# Patient Record
Sex: Female | Born: 1935
Health system: Southern US, Community
[De-identification: ages and names within clinical notes are randomized; demographics above are authoritative.]

## PROBLEM LIST (undated history)

## (undated) DIAGNOSIS — M1990 Unspecified osteoarthritis, unspecified site: Secondary | ICD-10-CM

## (undated) DIAGNOSIS — I10 Essential (primary) hypertension: Secondary | ICD-10-CM

## (undated) HISTORY — DX: Essential (primary) hypertension: I10

---

## 2000-12-26 ENCOUNTER — Other Ambulatory Visit: Admission: RE | Admit: 2000-12-26 | Discharge: 2000-12-26 | Payer: Self-pay | Admitting: Family Medicine

## 2001-01-09 ENCOUNTER — Encounter: Payer: Self-pay | Admitting: Family Medicine

## 2003-02-07 HISTORY — PX: BREAST SURGERY: SHX581

## 2004-01-09 HISTORY — PX: BREAST BIOPSY: SHX20

## 2004-06-08 ENCOUNTER — Ambulatory Visit: Payer: Self-pay | Admitting: Family Medicine

## 2005-08-02 ENCOUNTER — Ambulatory Visit: Payer: Self-pay | Admitting: Family Medicine

## 2006-08-13 ENCOUNTER — Ambulatory Visit: Payer: Self-pay | Admitting: Family Medicine

## 2007-02-05 ENCOUNTER — Ambulatory Visit: Payer: Self-pay | Admitting: Family Medicine

## 2007-08-18 ENCOUNTER — Ambulatory Visit: Payer: Self-pay | Admitting: Family Medicine

## 2008-08-18 ENCOUNTER — Ambulatory Visit: Payer: Self-pay | Admitting: Family Medicine

## 2009-08-23 ENCOUNTER — Ambulatory Visit: Payer: Self-pay | Admitting: Family Medicine

## 2010-02-08 ENCOUNTER — Ambulatory Visit: Payer: Self-pay | Admitting: Family Medicine

## 2010-09-07 ENCOUNTER — Ambulatory Visit: Payer: Self-pay | Admitting: Family Medicine

## 2011-09-11 ENCOUNTER — Ambulatory Visit: Payer: Self-pay | Admitting: Family Medicine

## 2012-09-11 ENCOUNTER — Ambulatory Visit: Payer: Self-pay | Admitting: Family Medicine

## 2012-10-15 ENCOUNTER — Ambulatory Visit: Payer: Self-pay | Admitting: Family Medicine

## 2013-10-14 ENCOUNTER — Ambulatory Visit: Payer: Self-pay | Admitting: Family Medicine

## 2013-11-02 DIAGNOSIS — Z87898 Personal history of other specified conditions: Secondary | ICD-10-CM | POA: Insufficient documentation

## 2013-11-02 DIAGNOSIS — Z85828 Personal history of other malignant neoplasm of skin: Secondary | ICD-10-CM | POA: Insufficient documentation

## 2014-03-25 LAB — LIPID PANEL
CHOLESTEROL: 195 mg/dL (ref 0–200)
HDL: 47 mg/dL (ref 35–70)
LDL CALC: 112 mg/dL
LDL/HDL RATIO: 2.4
Triglycerides: 178 mg/dL — AB (ref 40–160)

## 2014-03-25 LAB — HEPATIC FUNCTION PANEL
ALT: 7 U/L (ref 7–35)
AST: 19 U/L (ref 13–35)
Alkaline Phosphatase: 53 U/L (ref 25–125)
BILIRUBIN, TOTAL: 0.5 mg/dL

## 2014-03-25 LAB — TSH: TSH: 2.15 u[IU]/mL (ref 0.41–5.90)

## 2014-03-25 LAB — CBC AND DIFFERENTIAL
HCT: 39 % (ref 36–46)
Hemoglobin: 12.6 g/dL (ref 12.0–16.0)
NEUTROS ABS: 2 /uL
PLATELETS: 219 10*3/uL (ref 150–399)
WBC: 3.5 10^3/mL

## 2014-03-25 LAB — BASIC METABOLIC PANEL
BUN: 13 mg/dL (ref 4–21)
Creatinine: 0.8 mg/dL (ref 0.5–1.1)
GLUCOSE: 88 mg/dL
Potassium: 4.1 mmol/L (ref 3.4–5.3)
Sodium: 136 mmol/L — AB (ref 137–147)

## 2014-06-22 ENCOUNTER — Telehealth: Payer: Self-pay | Admitting: Family Medicine

## 2014-06-22 NOTE — Telephone Encounter (Signed)
Pt stated that she has had an OV for her the numbness in her right leg just above her knee. Pt stated that she has been taking the Tylenol and it helped a little but it is getting worse. Pt stated she would like something to help it feel better. [place] Thanks TNP

## 2014-06-22 NOTE — Telephone Encounter (Signed)
Patient reports that she has had the pain for years (over 15), and states pain has worsened over the past 4-5 months. Patient denies any pain. However, she does experience some numbness and tingling that comes and goes. Patient reports that her symptoms is located on right leg just above her knee. Her symptoms does not radiate. He denies having any difficulty ambulating. She reports that the symptoms usually resolve with Tylenol, but in the past couple of weeks, patient has not had any relief. Patient is requesting something to be called in to help with her occasional pain. She uses [facility] [address]. Contact info is correct. Thanks!

## 2014-06-23 NOTE — Telephone Encounter (Signed)
May need to be further evaluated as this might be meralgia paresthetica which is a type of pinched nerve.

## 2014-06-25 NOTE — Telephone Encounter (Signed)
LMTCB ED 

## 2014-06-28 NOTE — Telephone Encounter (Signed)
Patient advised and will make appointment to be seen.  Kaitlyn Zuniga

## 2014-06-29 ENCOUNTER — Ambulatory Visit (INDEPENDENT_AMBULATORY_CARE_PROVIDER_SITE_OTHER): Payer: Medicare Other | Admitting: Family Medicine

## 2014-06-29 ENCOUNTER — Ambulatory Visit
Admission: RE | Admit: 2014-06-29 | Discharge: 2014-06-29 | Disposition: A | Discharge status: Home / Self Care | Payer: Medicare Other | Source: Ambulatory Visit | Attending: Family Medicine | Admitting: Family Medicine

## 2014-06-29 ENCOUNTER — Encounter: Payer: Self-pay | Admitting: Family Medicine

## 2014-06-29 VITALS — BP 126/74 | HR 78 | Temp 98.1°F | Resp 16 | Ht 65.0 in | Wt 164.8 lb

## 2014-06-29 DIAGNOSIS — G5711 Meralgia paresthetica, right lower limb: Secondary | ICD-10-CM | POA: Diagnosis not present

## 2014-06-29 NOTE — Patient Instructions (Signed)
We will call with x-ray result

## 2014-06-29 NOTE — Progress Notes (Signed)
Subjective:     Patient ID: Kaitlyn Zuniga, female   DOB: 02-Aug-1932, 81 y.o.   MRN: [id number]  HPI  Chief Complaint  Patient presents with  . Numbness    Patient comes in office today with concerns of numbness around her right thigh. Patient states for the past 16 years she had dealt with numbness on the top of her thigh it a general spot but has since spread in the past 5 months to the entire thigh.      Review of Systems  Musculoskeletal: Negative for back pain.       States her thigh is mildly uncomfortable when she touches it. No change in muscle strenght       Objective:   Physical Exam Muscle strength in lower extremities 5/5. SLR's to 90 degrees without back pain or radiation of pain.    Assessment:     1. Meralgia paresthetica, right - DG Lumbar Spine Complete; Future    Plan:     Consider neurology evaluation if patient wishes to proceed further

## 2014-06-30 ENCOUNTER — Telehealth: Payer: Self-pay

## 2014-06-30 DIAGNOSIS — G5711 Meralgia paresthetica, right lower limb: Secondary | ICD-10-CM

## 2014-06-30 NOTE — Telephone Encounter (Signed)
-----   Message from Carmon Ginsberg, [place] sent at 06/29/2014  4:25 PM EDT ----- Mild arthritic changes. No fractures or bony destruction. Does she wish to see a neurologist?

## 2014-06-30 NOTE — Telephone Encounter (Signed)
Advised pt of results. Pt does wish to see neurology. Renaldo Fiddler, CMA

## 2014-07-02 DIAGNOSIS — G5710 Meralgia paresthetica, unspecified lower limb: Secondary | ICD-10-CM | POA: Insufficient documentation

## 2014-07-02 NOTE — Telephone Encounter (Signed)
I placed the order in the chart. Judson Roch, did this go into your work queue? Renaldo Fiddler, CMA

## 2014-07-02 NOTE — Telephone Encounter (Signed)
Pt called because she hasn't heard anything about getting a neuro appointment.  Thanks TNP

## 2014-07-06 NOTE — Telephone Encounter (Signed)
-----   Message from Carmon Ginsberg, [place] sent at 06/29/2014  4:25 PM EDT ----- Mild arthritic changes. No fractures or bony destruction. Does she wish to see a neurologist?

## 2014-07-06 NOTE — Telephone Encounter (Signed)
Patient has been advised

## 2014-09-28 ENCOUNTER — Encounter: Payer: Self-pay | Admitting: Family Medicine

## 2014-09-28 ENCOUNTER — Other Ambulatory Visit: Payer: Self-pay | Admitting: Family Medicine

## 2014-09-28 ENCOUNTER — Ambulatory Visit (INDEPENDENT_AMBULATORY_CARE_PROVIDER_SITE_OTHER): Payer: Medicare Other | Admitting: Family Medicine

## 2014-09-28 VITALS — BP 146/70 | HR 90 | Temp 98.2°F | Resp 16 | Ht 65.0 in | Wt 164.0 lb

## 2014-09-28 DIAGNOSIS — D239 Other benign neoplasm of skin, unspecified: Secondary | ICD-10-CM | POA: Insufficient documentation

## 2014-09-28 DIAGNOSIS — I10 Essential (primary) hypertension: Secondary | ICD-10-CM | POA: Insufficient documentation

## 2014-09-28 DIAGNOSIS — E7800 Pure hypercholesterolemia, unspecified: Secondary | ICD-10-CM | POA: Insufficient documentation

## 2014-09-28 DIAGNOSIS — G5710 Meralgia paresthetica, unspecified lower limb: Secondary | ICD-10-CM | POA: Insufficient documentation

## 2014-09-28 DIAGNOSIS — Z85828 Personal history of other malignant neoplasm of skin: Secondary | ICD-10-CM | POA: Insufficient documentation

## 2014-09-28 DIAGNOSIS — M8580 Other specified disorders of bone density and structure, unspecified site: Secondary | ICD-10-CM | POA: Insufficient documentation

## 2014-09-28 DIAGNOSIS — Z8742 Personal history of other diseases of the female genital tract: Secondary | ICD-10-CM | POA: Insufficient documentation

## 2014-09-28 DIAGNOSIS — Z0000 Encounter for general adult medical examination without abnormal findings: Secondary | ICD-10-CM

## 2014-09-28 DIAGNOSIS — Z23 Encounter for immunization: Secondary | ICD-10-CM

## 2014-09-28 DIAGNOSIS — M1990 Unspecified osteoarthritis, unspecified site: Secondary | ICD-10-CM | POA: Insufficient documentation

## 2014-09-28 DIAGNOSIS — J302 Other seasonal allergic rhinitis: Secondary | ICD-10-CM | POA: Insufficient documentation

## 2014-09-28 DIAGNOSIS — G5600 Carpal tunnel syndrome, unspecified upper limb: Secondary | ICD-10-CM | POA: Insufficient documentation

## 2014-09-28 DIAGNOSIS — F09 Unspecified mental disorder due to known physiological condition: Secondary | ICD-10-CM | POA: Insufficient documentation

## 2014-09-28 DIAGNOSIS — Z1231 Encounter for screening mammogram for malignant neoplasm of breast: Secondary | ICD-10-CM

## 2014-09-28 NOTE — Progress Notes (Signed)
Patient ID: Mcneil Sober, female   DOB: 1933/01/02, 81 y.o.   MRN: [id number] Patient: Kaitlyn Zuniga, Female    DOB: 08/31/1932, 81 y.o.   MRN: [id number] Visit Date: 09/28/2014  Today's Provider: [name], MD   Chief Complaint  Patient presents with  . Annual Exam   Subjective:   Kaitlyn Zuniga is a 81 y.o. female who presents today for her Subsequent Annual Wellness Visit. She feels well. She reports she is exercising about 5 days a wee. She reports she is sleeping well.    Prevnar- 09/24/13 Tdap 10/18/10 Zoster 01/10/06 Pap- 01/29/07 WNL mammo 09/2013 Colon- 01/09/01- Small superficial almost puncture rectal ulcers BMD- 10/15/12 Osteopenia (femur area) otherwise normal   Review of Systems  Constitutional: Negative.   HENT: Positive for hearing loss and sinus pressure.   Eyes: Negative.   Respiratory: Negative.   Cardiovascular: Negative.   Gastrointestinal: Positive for constipation.  Endocrine: Negative.   Genitourinary: Negative.   Musculoskeletal: Negative.   Skin: Negative.   Allergic/Immunologic: Negative.   Neurological: Positive for numbness.  Hematological: Negative.   Psychiatric/Behavioral: Negative.     Patient Active Problem List   Diagnosis Date Noted  . Dermatofibroma 09/28/2014  . Essential (primary) hypertension 09/28/2014  . Personal history of reproductive and obstetrical problems 09/28/2014  . History of other malignant neoplasm of skin 09/28/2014  . Mild cognitive disorder 09/28/2014  . Bernhardt's paresthesia 09/28/2014  . Arthritis, degenerative 09/28/2014  . Osteopenia 09/28/2014  . Allergic rhinitis, seasonal 09/28/2014  . Hypercholesterolemia without hypertriglyceridemia 09/28/2014  . Carpal tunnel syndrome 09/28/2014  . Meralgia paraesthetica 07/02/2014  . H/O neoplasm 11/02/2013    Social History   Social History  . Marital Status: Married    Spouse Name: N/A  . Number of Children: N/A  . Years of Education: N/A    Occupational History  . Not on file.   Social History Main Topics  . Smoking status: Former Smoker -- 10 years  . Smokeless tobacco: Never Used     Comment: quit 30 years ago.   . Alcohol Use: No  . Drug Use: No  . Sexual Activity: Not on file   Other Topics Concern  . Not on file   Social History Narrative    Past Surgical History  Procedure Laterality Date  . Breast surgery Right [id number]    Biopsy    Her family history includes Cancer in her father and mother; Cancer (age of onset: 41) in her sister; Hypertension in her father.    Outpatient Prescriptions Prior to Visit  Medication Sig Dispense Refill  . aspirin EC 81 MG tablet Take 81 mg by mouth.    Marland Kitchen lisinopril (PRINIVIL,ZESTRIL) 10 MG tablet   3  . fluticasone (FLONASE) 50 MCG/ACT nasal spray   0  . Multiple Vitamin (MULTI-VITAMINS) TABS Take by mouth.    . Omega-3 Fatty Acids (FISH OIL) 1000 MG CAPS Take by mouth.     No facility-administered medications prior to visit.    No Known Allergies  Patient Care Team: Jerrol Banana., MD as PCP - General (Family Medicine)  Objective:   Vitals:  Filed Vitals:   09/28/14 0911  BP: 146/70  Pulse: 90  Temp: 98.2 F (36.8 C)  TempSrc: Oral  Resp: 16  Height: 5\' 5"  (1.651 m)  Weight: 164 lb (74.39 kg)    Physical Exam  Constitutional: She is oriented to person, place, and time. She appears well-developed and well-nourished.  HENT:  Head: Normocephalic and atraumatic.  Right Ear: External ear normal.  Left Ear: External ear normal.  Nose: Nose normal.  Mouth/Throat: Oropharynx is clear and moist.  Eyes: Conjunctivae are normal.  Neck: Neck supple.  Cardiovascular: Normal rate, regular rhythm and normal heart sounds.   Pulmonary/Chest: Effort normal and breath sounds normal.  Abdominal: Soft.  Neurological: She is alert and oriented to person, place, and time.  Skin: Skin is warm and dry.  Psychiatric: She has a normal mood and affect. Her  behavior is normal. Judgment and thought content normal.    Activities of Daily Living In your present state of health, do you have any difficulty performing the following activities: 09/28/2014  Hearing? Y  Vision? N  Difficulty concentrating or making decisions? N  Walking or climbing stairs? N  Dressing or bathing? N  Doing errands, shopping? N    Fall Risk Assessment Fall Risk  09/28/2014  Falls in the past year? No     Depression Screen PHQ 2/9 Scores 09/28/2014  PHQ - 2 Score 0    Cognitive Testing - 6-CIT    Year: 0  points  Month: 0 points  Memorize "[name], [address], [place]"  Time (within 1 hour:) 0 points  Count backwards from 20: 0 points  Name months of year: 0 points  Repeat Address: 7  points   Total Score: 7/28  Interpretation : Normal (0-7) Abnormal (8-28)    Assessment & Plan:     Annual Wellness Visit  Reviewed patient's Family Medical History Reviewed and updated list of patient's medical providers Assessment of cognitive impairment was done Assessed patient's functional ability Established a written schedule for health screening [place] Completed and Reviewed  Exercise Activities and Dietary recommendations Goals    None       There is no immunization history on file for this patient.  Health Maintenance  Topic Date Due  . TETANUS/TDAP  03/08/1953  . COLONOSCOPY  03/08/1984  . ZOSTAVAX  03/09/1994  . DEXA SCAN  03/09/1999  . PNA vac Low Risk Adult (1 of 2 - PCV13) 03/09/1999  . INFLUENZA VACCINE  08/09/2014      Discussed health benefits of physical activity, and encouraged her to engage in regular exercise appropriate for her age and condition.  White coat hypertension Chronic idiopathic constipation Patient uses prunes and milk of magnesia  [facility] 09/28/2014 9:17  AM  ------------------------------------------------------------------------------------------------------------

## 2014-10-06 ENCOUNTER — Other Ambulatory Visit: Payer: Self-pay | Admitting: Family Medicine

## 2014-10-18 ENCOUNTER — Ambulatory Visit
Admission: RE | Admit: 2014-10-18 | Discharge: 2014-10-18 | Disposition: A | Discharge status: Home / Self Care | Payer: Medicare Other | Source: Ambulatory Visit | Attending: Family Medicine | Admitting: Family Medicine

## 2014-10-18 DIAGNOSIS — Z1231 Encounter for screening mammogram for malignant neoplasm of breast: Secondary | ICD-10-CM | POA: Diagnosis not present

## 2015-01-09 ENCOUNTER — Other Ambulatory Visit: Payer: Self-pay | Admitting: Family Medicine

## 2015-02-16 DIAGNOSIS — L570 Actinic keratosis: Secondary | ICD-10-CM | POA: Diagnosis not present

## 2015-02-16 DIAGNOSIS — D225 Melanocytic nevi of trunk: Secondary | ICD-10-CM | POA: Diagnosis not present

## 2015-02-16 DIAGNOSIS — L538 Other specified erythematous conditions: Secondary | ICD-10-CM | POA: Diagnosis not present

## 2015-02-16 DIAGNOSIS — D2262 Melanocytic nevi of left upper limb, including shoulder: Secondary | ICD-10-CM | POA: Diagnosis not present

## 2015-02-16 DIAGNOSIS — X32XXXA Exposure to sunlight, initial encounter: Secondary | ICD-10-CM | POA: Diagnosis not present

## 2015-02-16 DIAGNOSIS — L298 Other pruritus: Secondary | ICD-10-CM | POA: Diagnosis not present

## 2015-02-16 DIAGNOSIS — D2272 Melanocytic nevi of left lower limb, including hip: Secondary | ICD-10-CM | POA: Diagnosis not present

## 2015-02-16 DIAGNOSIS — L821 Other seborrheic keratosis: Secondary | ICD-10-CM | POA: Diagnosis not present

## 2015-02-16 DIAGNOSIS — L820 Inflamed seborrheic keratosis: Secondary | ICD-10-CM | POA: Diagnosis not present

## 2015-03-25 DIAGNOSIS — H40003 Preglaucoma, unspecified, bilateral: Secondary | ICD-10-CM | POA: Diagnosis not present

## 2015-03-28 ENCOUNTER — Ambulatory Visit (INDEPENDENT_AMBULATORY_CARE_PROVIDER_SITE_OTHER): Payer: Medicare Other | Admitting: Family Medicine

## 2015-03-28 VITALS — BP 132/58 | HR 90 | Temp 98.3°F | Resp 16 | Wt 165.0 lb

## 2015-03-28 DIAGNOSIS — E785 Hyperlipidemia, unspecified: Secondary | ICD-10-CM | POA: Diagnosis not present

## 2015-03-28 DIAGNOSIS — I10 Essential (primary) hypertension: Secondary | ICD-10-CM

## 2015-03-28 NOTE — Progress Notes (Signed)
Patient ID: Kaitlyn Zuniga, female   DOB: 07/13/1932, 82 y.o.   MRN: [id number]   Kaitlyn Zuniga  MRN: [id number] DOB: 03/16/1932  Subjective:  HPI   1. Essential hypertension 2. Hyperlipidemia  The patient is an 82 year old female who presents for 6 month follow up.  She was last seen in September for her wellness exam.  She reports that her blood pressure has been well.  Running usually 120's-130's over 60's.  She is currently on Lisinopril and has no complaints or adverse effects of the medicine.  She is currently not on any medicine for her cholesterol and has never taken any.  She reports that since her last visit she has been to see Dr. Manuella Ghazi, Neurologist in October regarding the numbness in her right thigh.  She reports that he performed acupuncture and the numbness that has been there for 16 years is now gone. She does state that she has begun having numbness in her right first 2 fingers.  Dr Manuella Ghazi has told her to wait and see how it does before doing anything.   She has also been to see her eye doctor, Dr. Mali and reports her vision was 20/20 in one eye and 20/25 in the other.   The patient states she has had a little bit of a cold for the last 4 days with some cough.  Usually her cough is at night and productive of clear sputum.       Patient Active Problem List   Diagnosis Date Noted  . Dermatofibroma 09/28/2014  . Essential (primary) hypertension 09/28/2014  . Personal history of reproductive and obstetrical problems 09/28/2014  . History of other malignant neoplasm of skin 09/28/2014  . Mild cognitive disorder 09/28/2014  . Bernhardt's paresthesia 09/28/2014  . Arthritis, degenerative 09/28/2014  . Osteopenia 09/28/2014  . Allergic rhinitis, seasonal 09/28/2014  . Hypercholesterolemia without hypertriglyceridemia 09/28/2014  . Carpal tunnel syndrome 09/28/2014  . Meralgia paraesthetica 07/02/2014  . H/O neoplasm 11/02/2013    Past Medical History  Diagnosis Date    . Hypertension     Social History   Social History  . Marital Status: Married    Spouse Name: N/A  . Number of Children: N/A  . Years of Education: N/A   Occupational History  . Not on file.   Social History Main Topics  . Smoking status: Former Smoker -- 10 years  . Smokeless tobacco: Never Used     Comment: quit 30 years ago.   . Alcohol Use: No  . Drug Use: No  . Sexual Activity: Not on file   Other Topics Concern  . Not on file   Social History Narrative    Outpatient Prescriptions Prior to Visit  Medication Sig Dispense Refill  . aspirin EC 81 MG tablet Take 81 mg by mouth.    . fluticasone (FLONASE) 50 MCG/ACT nasal spray Place into the nose.    Marland Kitchen lisinopril (PRINIVIL,ZESTRIL) 10 MG tablet TAKE ONE TABLET BY MOUTH ONCE DAILY 90 tablet 0  . MULTIPLE VITAMIN PO Take by mouth.    . Omega-3 Fatty Acids (FISH OIL) 1200 MG CAPS Take by mouth.     No facility-administered medications prior to visit.    No Known Allergies  Review of Systems  Constitutional: Negative for fever, chills, malaise/fatigue and diaphoresis.  Respiratory: Positive for cough and sputum production. Negative for shortness of breath and wheezing.   Cardiovascular: Negative for chest pain, palpitations, orthopnea and leg swelling.  Neurological:  Negative for dizziness, weakness and headaches.   Objective:  BP 132/58 mmHg  Pulse 90  Temp(Src) 98.3 F (36.8 C) (Oral)  Resp 16  Wt 165 lb (74.844 kg)  Physical Exam  Constitutional: She is oriented to person, place, and time and well-developed, well-nourished, and in no distress.  HENT:  Head: Normocephalic and atraumatic.  Right Ear: External ear normal.  Left Ear: External ear normal.  Nose: Nose normal.  Eyes: Conjunctivae are normal. Pupils are equal, round, and reactive to light.  Neck: Normal range of motion.  Cardiovascular: Normal rate, regular rhythm and normal heart sounds.   Pulmonary/Chest: Effort normal and breath sounds  normal.  Neurological: She is alert and oriented to person, place, and time. Gait normal.  Neuro exam of upper extremities is normal.  Skin: Skin is warm and dry.  Psychiatric: Mood, memory, affect and judgment normal.    Assessment and Plan :   1. Essential hypertension  - CBC With Differential/Platelet - COMPLETE METABOLIC PANEL WITH GFR - TSH  2. Hyperlipidemia  - Lipid Panel With LDL/HDL Ratio 3. 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 She sees Dr. Manuella Ghazi. I have done the exam and reviewed the above chart and it is accurate to the best of my knowledge.  Miguel Aschoff MD [facility] 03/28/2015 8:16 AM

## 2015-03-30 DIAGNOSIS — I10 Essential (primary) hypertension: Secondary | ICD-10-CM | POA: Diagnosis not present

## 2015-03-30 DIAGNOSIS — E785 Hyperlipidemia, unspecified: Secondary | ICD-10-CM | POA: Diagnosis not present

## 2015-03-31 LAB — LIPID PANEL WITH LDL/HDL RATIO
Cholesterol, Total: 154 mg/dL (ref 100–199)
HDL: 38 mg/dL — ABNORMAL LOW (ref >39)
LDL Calculated: 88 mg/dL (ref 0–99)
LDL/HDL RATIO: 2.3 ratio (ref 0.0–3.2)
Triglycerides: 140 mg/dL (ref 0–149)
VLDL Cholesterol Cal: 28 mg/dL (ref 5–40)

## 2015-03-31 LAB — COMPREHENSIVE METABOLIC PANEL
A/G RATIO: 1.9 (ref 1.2–2.2)
ALBUMIN: 4.3 g/dL (ref 3.5–4.7)
ALT: 8 IU/L (ref 0–32)
AST: 19 IU/L (ref 0–40)
Alkaline Phosphatase: 54 IU/L (ref 39–117)
BUN/Creatinine Ratio: 14 (ref 11–26)
BUN: 13 mg/dL (ref 8–27)
Bilirubin Total: 0.5 mg/dL (ref 0.0–1.2)
CALCIUM: 9.6 mg/dL (ref 8.7–10.3)
CO2: 26 mmol/L (ref 18–29)
CREATININE: 0.91 mg/dL (ref 0.57–1.00)
Chloride: 98 mmol/L (ref 96–106)
GFR calc Af Amer: 68 mL/min/{1.73_m2} (ref >59)
GFR, EST NON AFRICAN AMERICAN: 59 mL/min/{1.73_m2} — AB (ref >59)
GLOBULIN, TOTAL: 2.3 g/dL (ref 1.5–4.5)
Glucose: 95 mg/dL (ref 65–99)
POTASSIUM: 4.2 mmol/L (ref 3.5–5.2)
SODIUM: 138 mmol/L (ref 134–144)
Total Protein: 6.6 g/dL (ref 6.0–8.5)

## 2015-03-31 LAB — CBC WITH DIFFERENTIAL/PLATELET
Basophils Absolute: 0 10*3/uL (ref 0.0–0.2)
Basos: 1 %
EOS (ABSOLUTE): 0.1 10*3/uL (ref 0.0–0.4)
EOS: 1 %
HEMATOCRIT: 35.7 % (ref 34.0–46.6)
HEMOGLOBIN: 12.1 g/dL (ref 11.1–15.9)
Immature Grans (Abs): 0 10*3/uL (ref 0.0–0.1)
Immature Granulocytes: 0 %
LYMPHS ABS: 1.8 10*3/uL (ref 0.7–3.1)
Lymphs: 50 %
MCH: 30.2 pg (ref 26.6–33.0)
MCHC: 33.9 g/dL (ref 31.5–35.7)
MCV: 89 fL (ref 79–97)
MONOCYTES: 10 %
MONOS ABS: 0.4 10*3/uL (ref 0.1–0.9)
Neutrophils Absolute: 1.3 10*3/uL — ABNORMAL LOW (ref 1.4–7.0)
Neutrophils: 38 %
Platelets: 198 10*3/uL (ref 150–379)
RBC: 4.01 x10E6/uL (ref 3.77–5.28)
RDW: 13.9 % (ref 12.3–15.4)
WBC: 3.5 10*3/uL (ref 3.4–10.8)

## 2015-03-31 LAB — TSH: TSH: 1.92 u[IU]/mL (ref 0.450–4.500)

## 2015-04-10 ENCOUNTER — Other Ambulatory Visit: Payer: Self-pay | Admitting: Family Medicine

## 2015-07-25 ENCOUNTER — Other Ambulatory Visit: Payer: Self-pay | Admitting: Family Medicine

## 2015-07-29 DIAGNOSIS — G5711 Meralgia paresthetica, right lower limb: Secondary | ICD-10-CM | POA: Diagnosis not present

## 2015-07-29 DIAGNOSIS — R200 Anesthesia of skin: Secondary | ICD-10-CM | POA: Diagnosis not present

## 2015-09-26 DIAGNOSIS — G5603 Carpal tunnel syndrome, bilateral upper limbs: Secondary | ICD-10-CM | POA: Diagnosis not present

## 2015-09-27 DIAGNOSIS — G5601 Carpal tunnel syndrome, right upper limb: Secondary | ICD-10-CM | POA: Diagnosis not present

## 2015-09-29 ENCOUNTER — Other Ambulatory Visit: Payer: Self-pay | Admitting: Family Medicine

## 2015-09-29 ENCOUNTER — Ambulatory Visit (INDEPENDENT_AMBULATORY_CARE_PROVIDER_SITE_OTHER): Payer: Medicare Other | Admitting: Family Medicine

## 2015-09-29 VITALS — BP 166/60 | HR 74 | Temp 97.8°F | Resp 16 | Ht 64.5 in | Wt 160.0 lb

## 2015-09-29 DIAGNOSIS — Z23 Encounter for immunization: Secondary | ICD-10-CM

## 2015-09-29 DIAGNOSIS — Z1231 Encounter for screening mammogram for malignant neoplasm of breast: Secondary | ICD-10-CM

## 2015-09-29 DIAGNOSIS — Z1211 Encounter for screening for malignant neoplasm of colon: Secondary | ICD-10-CM

## 2015-09-29 NOTE — Progress Notes (Signed)
Patient: Kaitlyn Zuniga, Female    DOB: January 09, 1932, 82 y.o.   MRN: [id number] Visit Date: 09/29/2015  Today's Provider: [name], MD   No chief complaint on file.  Subjective:   Kaitlyn Zuniga is a 82 y.o. female who presents today for her Subsequent Annual Wellness Visit. She feels well. She reports exercising 3 times per week. She reports she is sleeping well.  01/09/2001-Colonoscopy 02/08/2010 BMD 10/19/2014-Mammogram  Immunization History  Administered Date(s) Administered  . Influenza, High Dose Seasonal PF 09/28/2014  . Pneumococcal Conjugate-13 09/24/2013  . Pneumococcal Polysaccharide-23 11/16/1999  . Td 06/06/2004  . Tdap 10/18/2010  . Zoster 01/10/2006   Patient would like to receive her flu shot today.  Review of Systems  Constitutional: Negative.   HENT: Positive for hearing loss.   Eyes: Negative.   Respiratory: Negative.   Cardiovascular: Negative.   Gastrointestinal: Negative.   Endocrine: Negative.   Genitourinary: Positive for difficulty urinating.  Musculoskeletal: Negative.   Allergic/Immunologic: Positive for environmental allergies.  Neurological: Positive for numbness.  Hematological: Bruises/bleeds easily.  Psychiatric/Behavioral: Negative.     Patient Active Problem List   Diagnosis Date Noted  . Dermatofibroma 09/28/2014  . Essential (primary) hypertension 09/28/2014  . Personal history of reproductive and obstetrical problems 09/28/2014  . History of other malignant neoplasm of skin 09/28/2014  . Mild cognitive disorder 09/28/2014  . Bernhardt's paresthesia 09/28/2014  . Arthritis, degenerative 09/28/2014  . Osteopenia 09/28/2014  . Allergic rhinitis, seasonal 09/28/2014  . Hypercholesterolemia without hypertriglyceridemia 09/28/2014  . Carpal tunnel syndrome 09/28/2014  . Meralgia paraesthetica 07/02/2014  . H/O neoplasm 11/02/2013    Social History   Social History  . Marital status: Married    Spouse name: N/A  .  Number of children: N/A  . Years of education: N/A   Occupational History  . Not on file.   Social History Main Topics  . Smoking status: Former Smoker    Years: 10.00  . Smokeless tobacco: Never Used     Comment: quit 30 years ago.   . Alcohol use No  . Drug use: No  . Sexual activity: Not on file   Other Topics Concern  . Not on file   Social History Narrative  . No narrative on file    Past Surgical History:  Procedure Laterality Date  . BREAST BIOPSY Right 2006   benign  . BREAST SURGERY Right [id number]   Biopsy    Her family history includes Breast cancer (age of onset: 57) in her sister; Cancer in her father and mother; Hypertension in her father.    Outpatient Encounter Prescriptions as of 09/29/2015  Medication Sig Note  . Alpha-Lipoic Acid 200 MG CAPS Take by mouth. 03/28/2015: Received from: [facility]  . aspirin EC 81 MG tablet Take 81 mg by mouth. 06/29/2014: Received from: [facility]  . calcium carbonate (CALCIUM 600) 600 MG TABS tablet Take by mouth. 03/28/2015: Received from: [facility]  . fluticasone (FLONASE) 50 MCG/ACT nasal spray USE ONE TO TWO SPRAY(S) IN EACH NOSTRIL ONCE DAILY   . lisinopril (PRINIVIL,ZESTRIL) 10 MG tablet TAKE ONE TABLET BY MOUTH ONCE DAILY   . MULTIPLE VITAMIN PO Take by mouth. 09/28/2014: Received from: [facility]  . Omega-3 Fatty Acids (FISH OIL) 1200 MG CAPS Take by mouth. 09/28/2014: Received from: [facility]   No facility-administered encounter medications on file as of 09/29/2015.     No Known Allergies  Patient Care  Team: Jerrol Banana., MD as PCP - General (Family Medicine)  Objective:   Vitals:  Vitals:   09/29/15 0912  BP: (!) 166/60  Pulse: 74  Resp: 16  Temp: 97.8 F (36.6 C)  TempSrc: Oral  Weight: 160 lb (72.6 kg)  Height: 5' 4.5" (1.638 m)    Physical Exam  Constitutional: She is oriented to person, place, and time.  She appears well-developed and well-nourished.  HENT:  Head: Normocephalic and atraumatic.  Right Ear: External ear normal.  Mouth/Throat: Oropharynx is clear and moist.  Eyes: Conjunctivae are normal. Pupils are equal, round, and reactive to light.  Neck: Normal range of motion. Neck supple.  Cardiovascular: Normal rate, regular rhythm, normal heart sounds and intact distal pulses.   Pulmonary/Chest: Effort normal and breath sounds normal.  Abdominal: Soft. Bowel sounds are normal.  Musculoskeletal: Normal range of motion.  Neurological: She is alert and oriented to person, place, and time. She has normal reflexes.  Skin: Skin is warm and dry.  Psychiatric: She has a normal mood and affect. Her behavior is normal. Judgment and thought content normal.    Activities of Daily Living In your present state of health, do you have any difficulty performing the following activities: 09/29/2015  Hearing? N  Vision? N  Difficulty concentrating or making decisions? N  Walking or climbing stairs? N  Dressing or bathing? N  Doing errands, shopping? N  Some recent data might be hidden    Fall Risk Assessment Fall Risk  09/29/2015 09/28/2014  Falls in the past year? No No     Depression Screen PHQ 2/9 Scores 09/29/2015 09/28/2014  PHQ - 2 Score 0 0   Current Exercise Habits: Home exercise routine, Type of exercise: walking, Time (Minutes): 45, Frequency (Times/Week): 3, Weekly Exercise (Minutes/Week): 135, Intensity: Intense   Cognitive Testing - 6-CIT    Year: 0 4 points  Month: 0 3 points  Memorize "[name], [address], [place]"  Time (within 1 hour:) 0 3 points  Count backwards from 20: 0 2 4 points  Name months of year: 0 2 4 points  Repeat Address: 0 2 4 6 8 10  points   Total Score: 6/28  Interpretation : Normal (0-7) Abnormal (8-28)    Assessment & Plan:     Annual Wellness Visit  Reviewed patient's Family Medical History Reviewed and updated list of patient's  medical providers Assessment of cognitive impairment was done Assessed patient's functional ability Established a written schedule for health screening [place] Completed and Reviewed  Exercise Activities and Dietary recommendations Goals    None      Immunization History  Administered Date(s) Administered  . Influenza, High Dose Seasonal PF 09/28/2014  . Pneumococcal Conjugate-13 09/24/2013  . Pneumococcal Polysaccharide-23 11/16/1999  . Td 06/06/2004  . Tdap 10/18/2010  . Zoster 01/10/2006    Health Maintenance  Topic Date Due  . INFLUENZA VACCINE  08/09/2015  . TETANUS/TDAP  10/17/2020  . DEXA SCAN  Completed  . ZOSTAVAX  Completed  . PNA vac Low Risk Adult  Completed     Colon cancer screening with cologard in 82 year old female Discussed health benefits of physical activity, and encouraged her to engage in regular exercise appropriate for her age and condition.    HPI, Exam and A&P Transcribed under the direction and in the presence of [name]., MD. Electronically Signed: Althea Charon, RMA I have done the exam and reviewed the chart and it is accurate  to the best of my knowledge. Miguel Aschoff M.D. [facility]

## 2015-10-19 ENCOUNTER — Ambulatory Visit
Admission: RE | Admit: 2015-10-19 | Discharge: 2015-10-19 | Disposition: A | Discharge status: Home / Self Care | Payer: Medicare Other | Source: Ambulatory Visit | Attending: Family Medicine | Admitting: Family Medicine

## 2015-10-19 DIAGNOSIS — Z1231 Encounter for screening mammogram for malignant neoplasm of breast: Secondary | ICD-10-CM | POA: Diagnosis not present

## 2015-10-21 ENCOUNTER — Telehealth: Payer: Self-pay | Admitting: Family Medicine

## 2015-10-21 NOTE — Telephone Encounter (Signed)
Order for cologuard faxed to [facility] °

## 2015-10-26 DIAGNOSIS — Z1211 Encounter for screening for malignant neoplasm of colon: Secondary | ICD-10-CM | POA: Diagnosis not present

## 2015-10-26 DIAGNOSIS — Z1212 Encounter for screening for malignant neoplasm of rectum: Secondary | ICD-10-CM | POA: Diagnosis not present

## 2015-10-27 LAB — COLOGUARD: COLOGUARD: NEGATIVE

## 2015-11-04 ENCOUNTER — Telehealth: Payer: Self-pay

## 2015-11-04 NOTE — Telephone Encounter (Signed)
lmtcb-need to let patient know Cologuard results were negative-aa

## 2015-11-04 NOTE — Telephone Encounter (Signed)
Pt returned call. Thanks TNP °

## 2015-11-04 NOTE — Telephone Encounter (Signed)
Patient advised as below.  

## 2015-11-29 DIAGNOSIS — G5603 Carpal tunnel syndrome, bilateral upper limbs: Secondary | ICD-10-CM | POA: Diagnosis not present

## 2015-11-29 DIAGNOSIS — G5711 Meralgia paresthetica, right lower limb: Secondary | ICD-10-CM | POA: Diagnosis not present

## 2015-12-15 ENCOUNTER — Encounter: Payer: Self-pay | Admitting: Family Medicine

## 2015-12-15 ENCOUNTER — Ambulatory Visit (INDEPENDENT_AMBULATORY_CARE_PROVIDER_SITE_OTHER): Payer: Medicare Other | Admitting: Family Medicine

## 2015-12-15 VITALS — BP 130/70 | HR 69 | Temp 98.2°F | Resp 14 | Wt 164.2 lb

## 2015-12-15 DIAGNOSIS — R3915 Urgency of urination: Secondary | ICD-10-CM

## 2015-12-15 LAB — POCT URINALYSIS DIPSTICK
BILIRUBIN UA: NEGATIVE
GLUCOSE UA: NEGATIVE
KETONES UA: NEGATIVE
NITRITE UA: NEGATIVE
Protein, UA: NEGATIVE
Spec Grav, UA: 1.01
Urobilinogen, UA: 0.2
pH, UA: 6

## 2015-12-15 MED ORDER — SULFAMETHOXAZOLE-TRIMETHOPRIM 800-160 MG PO TABS: 1.0000 | ORAL_TABLET | Freq: Two times a day (BID) | ORAL | 0 refills | Status: DC

## 2015-12-15 NOTE — Progress Notes (Signed)
Patient: Kaitlyn Zuniga Female    DOB: 20-Mar-1932   82 y.o.   MRN: [id number] Visit Date: 12/15/2015  Today's Provider: Vernie Murders, PA   Chief Complaint  Patient presents with  . Urinary Tract Infection   Subjective:    Urinary Tract Infection   This is a new problem. Episode onset: 4-5 days ago. The problem occurs every urination. The patient is experiencing no pain. Associated symptoms include frequency and urgency. Associated symptoms comments: Foul odor urine, mild back pain. Treatments tried: Cystex. The treatment provided mild relief.   Past Medical History:  Diagnosis Date  . Hypertension    Past Surgical History:  Procedure Laterality Date  . BREAST BIOPSY Right 2006   benign  . BREAST SURGERY Right [id number]   Biopsy   Family History  Problem Relation Age of Onset  . Cancer Mother     lung  . Cancer Father     prostate  . Hypertension Father   . Breast cancer Sister 28   No Known Allergies   Previous Medications   ALPHA-LIPOIC ACID 200 MG CAPS    Take by mouth.   ASPIRIN EC 81 MG TABLET    Take 81 mg by mouth.   CALCIUM CARBONATE (CALCIUM 600) 600 MG TABS TABLET    Take by mouth.   FLUTICASONE (FLONASE) 50 MCG/ACT NASAL SPRAY    USE ONE TO TWO SPRAY(S) IN EACH NOSTRIL ONCE DAILY   LISINOPRIL (PRINIVIL,ZESTRIL) 10 MG TABLET    TAKE ONE TABLET BY MOUTH ONCE DAILY   MULTIPLE VITAMIN PO    Take by mouth.   OMEGA-3 FATTY ACIDS (FISH OIL) 1200 MG CAPS    Take by mouth.    Review of Systems  Constitutional: Negative.   Respiratory: Negative.   Cardiovascular: Negative.   Genitourinary: Positive for frequency and urgency.    Social History  Substance Use Topics  . Smoking status: Former Smoker    Years: 10.00  . Smokeless tobacco: Never Used     Comment: quit 30 years ago.   . Alcohol use No   Objective:   BP 130/70 (BP Location: Right Arm, Patient Position: Sitting, Cuff Size: Normal)   Pulse 69   Temp 98.2 F (36.8 C) (Oral)   Resp 14   Wt  164 lb 3.2 oz (74.5 kg)   BMI 27.75 kg/m   Physical Exam  Constitutional: She appears well-developed and well-nourished. No distress.  HENT:  Head: Normocephalic and atraumatic.  Right Ear: Hearing normal.  Left Ear: Hearing normal.  Nose: Nose normal.  Eyes: Conjunctivae and lids are normal. Right eye exhibits no discharge. Left eye exhibits no discharge. No scleral icterus.  Cardiovascular: Regular rhythm.   Pulmonary/Chest: Effort normal and breath sounds normal. No respiratory distress.  Abdominal: Soft. There is tenderness.  Suprapubic pressure tenderness to palpation. No CVA tenderness to percussion posteriorly.  Musculoskeletal: Normal range of motion.  Skin: Skin is intact. No lesion and no rash noted.  Psychiatric: She has a normal mood and affect. Her speech is normal and behavior is normal. Thought content normal.      Assessment & Plan:     1. Urinary urgency Onset over the past 3-4 days without gross hematuria. No fever. Finds Cystex helps relieve symptoms. Urinalysis showed huge amount of bacterial rods with 15-20 WBC's and clumping. Will get urine C&S and start Septra-DS. Increase fluid intake and recheck pending reports from culture. - POCT Urinalysis Dipstick - Urine culture - sulfamethoxazole-trimethoprim (BACTRIM  DS,SEPTRA DS) 800-160 MG tablet; Take 1 tablet by mouth 2 (two) times daily.  Dispense: 20 tablet; Refill: 0

## 2015-12-15 NOTE — Patient Instructions (Signed)

## 2015-12-19 ENCOUNTER — Telehealth: Payer: Self-pay

## 2015-12-19 DIAGNOSIS — R3915 Urgency of urination: Secondary | ICD-10-CM

## 2015-12-19 LAB — URINE CULTURE

## 2015-12-19 MED ORDER — AMOXICILLIN-POT CLAVULANATE 875-125 MG PO TABS: 1.0000 | ORAL_TABLET | Freq: Two times a day (BID) | ORAL | 0 refills | Status: DC

## 2015-12-19 NOTE — Telephone Encounter (Signed)
-----   Message from Margo Common, [place] sent at 12/19/2015  8:05 AM EST ----- Bacteria isolated on culture was Enterococcus. Need to change antibiotic to one it is sensitive to - Amoxicillin/Clavulanate 875/125 mg BID #20. Drink extra fluids and use Cystex or AZO-Standard if needed for discomfort. Recheck in 10 days if any symptoms remain.

## 2015-12-19 NOTE — Telephone Encounter (Signed)
LMTCB, Amoxicillin sent to [place].

## 2015-12-20 NOTE — Telephone Encounter (Signed)
LMTCB

## 2015-12-20 NOTE — Telephone Encounter (Signed)
Advised pt of lab results. Pt verbally acknowledges understanding. Emily Drozdowski, CMA   

## 2016-02-16 DIAGNOSIS — D2261 Melanocytic nevi of right upper limb, including shoulder: Secondary | ICD-10-CM | POA: Diagnosis not present

## 2016-02-16 DIAGNOSIS — L821 Other seborrheic keratosis: Secondary | ICD-10-CM | POA: Diagnosis not present

## 2016-02-16 DIAGNOSIS — D2272 Melanocytic nevi of left lower limb, including hip: Secondary | ICD-10-CM | POA: Diagnosis not present

## 2016-02-16 DIAGNOSIS — D225 Melanocytic nevi of trunk: Secondary | ICD-10-CM | POA: Diagnosis not present

## 2016-03-29 ENCOUNTER — Ambulatory Visit (INDEPENDENT_AMBULATORY_CARE_PROVIDER_SITE_OTHER): Payer: Medicare Other | Admitting: Family Medicine

## 2016-03-29 VITALS — BP 134/62 | HR 76 | Temp 98.1°F | Wt 161.0 lb

## 2016-03-29 DIAGNOSIS — E7800 Pure hypercholesterolemia, unspecified: Secondary | ICD-10-CM | POA: Diagnosis not present

## 2016-03-29 DIAGNOSIS — I10 Essential (primary) hypertension: Secondary | ICD-10-CM | POA: Diagnosis not present

## 2016-03-29 NOTE — Progress Notes (Signed)
Kaitlyn Zuniga  MRN: [id number] DOB: 17-May-1932  Subjective:  HPI  The patient is an 83 year old female who presents for follow up of chronic issues.  She was last seen on 12/15/15 for an acute visit with one of the practice PAs.  Prior to that she was seen on 09/29/15 for her Medicare wellness physical.   She last had labs checked on 03/30/15 and is due to have these done today.  1. Essential (primary) hypertension The patient is currently on Lisinopril for her hypertension.  Her most recent BP recordings in the office are recorded.  She checks her blood pressure at home and had a reading this morning of 132/75.  BP Readings from Last 3 Encounters:  03/29/16 134/62  12/15/15 130/70  09/29/15 (!) 166/60    2. Hypercholesterolemia without hypertriglyceridemia The patient is also due to have her cholesterol levels checked.  She is currently not on any medication for hyperlipidemia.  Lab Results  Component Value Date   CHOL 154 03/30/2015   CHOL 195 03/25/2014   Lab Results  Component Value Date   HDL 38 (L) 03/30/2015   HDL 47 03/25/2014   Lab Results  Component Value Date   LDLCALC 88 03/30/2015   LDLCALC 112 03/25/2014   Lab Results  Component Value Date   TRIG 140 03/30/2015   TRIG 178 (A) 03/25/2014   No results found for: CHOLHDL No results found for: LDLDIRECT   Patient Active Problem List   Diagnosis Date Noted  . Dermatofibroma 09/28/2014  . Essential (primary) hypertension 09/28/2014  . Personal history of reproductive and obstetrical problems 09/28/2014  . History of other malignant neoplasm of skin 09/28/2014  . Mild cognitive disorder 09/28/2014  . Bernhardt's paresthesia 09/28/2014  . Arthritis, degenerative 09/28/2014  . Osteopenia 09/28/2014  . Allergic rhinitis, seasonal 09/28/2014  . Hypercholesterolemia without hypertriglyceridemia 09/28/2014  . Carpal tunnel syndrome 09/28/2014  . Meralgia paraesthetica 07/02/2014  . H/O neoplasm 11/02/2013     Past Medical History:  Diagnosis Date  . Hypertension     Social History   Social History  . Marital status: Married    Spouse name: N/A  . Number of children: N/A  . Years of education: N/A   Occupational History  . Not on file.   Social History Main Topics  . Smoking status: Former Smoker    Years: 10.00  . Smokeless tobacco: Never Used     Comment: quit 30 years ago.   . Alcohol use No  . Drug use: No  . Sexual activity: Not on file   Other Topics Concern  . Not on file   Social History Narrative  . No narrative on file    Outpatient Encounter Prescriptions as of 03/29/2016  Medication Sig Note  . Alpha-Lipoic Acid 200 MG CAPS Take by mouth. 03/28/2015: Received from: [facility]  . aspirin EC 81 MG tablet Take 81 mg by mouth. 06/29/2014: Received from: [facility]  . calcium carbonate (CALCIUM 600) 600 MG TABS tablet Take by mouth. 03/28/2015: Received from: [facility]  . fluticasone (FLONASE) 50 MCG/ACT nasal spray USE ONE TO TWO SPRAY(S) IN EACH NOSTRIL ONCE DAILY   . lisinopril (PRINIVIL,ZESTRIL) 10 MG tablet TAKE ONE TABLET BY MOUTH ONCE DAILY   . MULTIPLE VITAMIN PO Take by mouth. 09/28/2014: Received from: [facility]  . Omega-3 Fatty Acids (FISH OIL) 1200 MG CAPS Take by mouth. 09/28/2014: Received from: [facility]  . [  DISCONTINUED] amoxicillin-clavulanate (AUGMENTIN) 875-125 MG tablet Take 1 tablet by mouth 2 (two) times daily.    No facility-administered encounter medications on file as of 03/29/2016.     No Known Allergies  Review of Systems  Constitutional: Negative for fever and malaise/fatigue.  Eyes: Negative.   Respiratory: Negative for cough, shortness of breath and wheezing.   Cardiovascular: Negative for chest pain, palpitations, orthopnea, claudication and leg swelling.  Gastrointestinal: Negative.   Musculoskeletal: Negative.   Skin: Negative.   Neurological:  Negative for dizziness, weakness and headaches.  Endo/Heme/Allergies: Negative.   Psychiatric/Behavioral: Negative.     Objective:  BP 134/62 (BP Location: Right Arm, Patient Position: Sitting, Cuff Size: Normal)   Pulse 76   Temp 98.1 F (36.7 C) (Oral)   Wt 161 lb (73 kg)   BMI 27.21 kg/m   Physical Exam  Constitutional: She is oriented to person, place, and time and well-developed, well-nourished, and in no distress.  HENT:  Head: Normocephalic and atraumatic.  Right Ear: External ear normal.  Left Ear: External ear normal.  Nose: Nose normal.  Eyes: Conjunctivae are normal. No scleral icterus.  Neck: No thyromegaly present.  Cardiovascular: Normal rate, regular rhythm and normal heart sounds.   Pulmonary/Chest: Effort normal and breath sounds normal.  Abdominal: Soft.  Musculoskeletal: She exhibits no edema.  Neurological: She is alert and oriented to person, place, and time. Gait normal. GCS score is 15.  Skin: Skin is dry.  Psychiatric: Mood, memory, affect and judgment normal.    Assessment and Plan :  1. Essential (primary) hypertension  - CBC with Differential/Platelet - Comprehensive metabolic panel - TSH  2. Hypercholesterolemia without hypertriglyceridemia  - Lipid Panel With LDL/HDL Ratio 3.Right Radial Neuropathy May need referral to dr Veronia Beets. I have done the exam and reviewed the chart and it is accurate to the best of my knowledge. Development worker, community has been used and  any errors in dictation or transcription are unintentional. Miguel Aschoff M.D. [facility]

## 2016-03-30 LAB — COMPREHENSIVE METABOLIC PANEL
A/G RATIO: 1.4 (ref 1.2–2.2)
ALBUMIN: 4.2 g/dL (ref 3.5–4.7)
ALK PHOS: 49 IU/L (ref 39–117)
ALT: 6 IU/L (ref 0–32)
AST: 19 IU/L (ref 0–40)
BILIRUBIN TOTAL: 0.5 mg/dL (ref 0.0–1.2)
BUN / CREAT RATIO: 16 (ref 12–28)
BUN: 12 mg/dL (ref 8–27)
CO2: 23 mmol/L (ref 18–29)
CREATININE: 0.75 mg/dL (ref 0.57–1.00)
Calcium: 9.7 mg/dL (ref 8.7–10.3)
Chloride: 102 mmol/L (ref 96–106)
GFR calc Af Amer: 86 mL/min/{1.73_m2} (ref >59)
GFR calc non Af Amer: 74 mL/min/{1.73_m2} (ref >59)
GLOBULIN, TOTAL: 2.9 g/dL (ref 1.5–4.5)
Glucose: 101 mg/dL — ABNORMAL HIGH (ref 65–99)
POTASSIUM: 4.1 mmol/L (ref 3.5–5.2)
SODIUM: 141 mmol/L (ref 134–144)
Total Protein: 7.1 g/dL (ref 6.0–8.5)

## 2016-03-30 LAB — CBC WITH DIFFERENTIAL/PLATELET
Basophils Absolute: 0 10*3/uL (ref 0.0–0.2)
Basos: 0 %
EOS (ABSOLUTE): 0 10*3/uL (ref 0.0–0.4)
EOS: 1 %
HEMATOCRIT: 38 % (ref 34.0–46.6)
HEMOGLOBIN: 12.9 g/dL (ref 11.1–15.9)
Immature Grans (Abs): 0 10*3/uL (ref 0.0–0.1)
Immature Granulocytes: 1 %
LYMPHS ABS: 1.6 10*3/uL (ref 0.7–3.1)
Lymphs: 42 %
MCH: 30.8 pg (ref 26.6–33.0)
MCHC: 33.9 g/dL (ref 31.5–35.7)
MCV: 91 fL (ref 79–97)
MONOCYTES: 14 %
MONOS ABS: 0.5 10*3/uL (ref 0.1–0.9)
NEUTROS ABS: 1.6 10*3/uL (ref 1.4–7.0)
Neutrophils: 42 %
Platelets: 205 10*3/uL (ref 150–379)
RBC: 4.19 x10E6/uL (ref 3.77–5.28)
RDW: 13.5 % (ref 12.3–15.4)
WBC: 3.8 10*3/uL (ref 3.4–10.8)

## 2016-03-30 LAB — LIPID PANEL WITH LDL/HDL RATIO
CHOLESTEROL TOTAL: 178 mg/dL (ref 100–199)
HDL: 44 mg/dL (ref >39)
LDL CALC: 103 mg/dL — AB (ref 0–99)
LDl/HDL Ratio: 2.3 ratio units (ref 0.0–3.2)
TRIGLYCERIDES: 157 mg/dL — AB (ref 0–149)
VLDL Cholesterol Cal: 31 mg/dL (ref 5–40)

## 2016-03-30 LAB — TSH: TSH: 1.74 u[IU]/mL (ref 0.450–4.500)

## 2016-04-02 ENCOUNTER — Telehealth: Payer: Self-pay | Admitting: Emergency Medicine

## 2016-04-02 NOTE — Telephone Encounter (Signed)
LMTCB

## 2016-04-02 NOTE — Telephone Encounter (Signed)
-----   Message from Jerrol Banana., MD sent at 04/02/2016 11:07 AM EDT ----- Labs stable.

## 2016-05-20 ENCOUNTER — Other Ambulatory Visit: Payer: Self-pay | Admitting: Family Medicine

## 2016-08-13 ENCOUNTER — Other Ambulatory Visit: Payer: Self-pay | Admitting: Family Medicine

## 2016-09-11 ENCOUNTER — Other Ambulatory Visit: Payer: Self-pay | Admitting: Family Medicine

## 2016-09-11 DIAGNOSIS — Z1231 Encounter for screening mammogram for malignant neoplasm of breast: Secondary | ICD-10-CM

## 2016-10-01 ENCOUNTER — Ambulatory Visit: Payer: Medicare Other | Admitting: Family Medicine

## 2016-10-01 ENCOUNTER — Ambulatory Visit (INDEPENDENT_AMBULATORY_CARE_PROVIDER_SITE_OTHER): Payer: Medicare Other | Admitting: Family Medicine

## 2016-10-01 ENCOUNTER — Encounter: Payer: Self-pay | Admitting: Family Medicine

## 2016-10-01 VITALS — BP 138/80 | HR 80 | Temp 97.9°F | Resp 14 | Wt 161.0 lb

## 2016-10-01 DIAGNOSIS — E7800 Pure hypercholesterolemia, unspecified: Secondary | ICD-10-CM

## 2016-10-01 DIAGNOSIS — I10 Essential (primary) hypertension: Secondary | ICD-10-CM | POA: Diagnosis not present

## 2016-10-01 DIAGNOSIS — Z23 Encounter for immunization: Secondary | ICD-10-CM | POA: Diagnosis not present

## 2016-10-01 DIAGNOSIS — M8580 Other specified disorders of bone density and structure, unspecified site: Secondary | ICD-10-CM

## 2016-10-01 NOTE — Patient Instructions (Signed)
Cock up wrist splint. Wear all the time except when you will be getting wet.

## 2016-10-01 NOTE — Progress Notes (Signed)
Patient: Kaitlyn Zuniga Female    DOB: Nov 18, 1932   83 y.o.   MRN: [id number] Visit Date: 10/01/2016  Today's Provider: [name], MD   Chief Complaint  Patient presents with  . Hypertension  . Hyperlipidemia   Subjective:    HPI  Hypertension, follow-up:  BP Readings from Last 3 Encounters:  10/01/16 138/80  03/29/16 134/62  12/15/15 130/70    She was last seen for hypertension 6 months ago.  BP at that visit was 134/62. Management since that visit includes none. She reports good compliance with treatment. She is not having side effects.  She is exercising. She is adherent to low salt diet.   Outside blood pressures are 120-140's/60-70's. Patient denies chest pain, chest pressure/discomfort, claudication, dyspnea, exertional chest pressure/discomfort, fatigue, irregular heart beat, lower extremity edema, near-syncope, orthopnea, palpitations, paroxysmal nocturnal dyspnea, syncope and tachypnea.   Cardiovascular risk factors include advanced age (older than 75 for men, 34 for women) and hypertension.  Wt Readings from Last 3 Encounters:  10/01/16 161 lb (73 kg)  03/29/16 161 lb (73 kg)  12/15/15 164 lb 3.2 oz (74.5 kg)   ------------------------------------------------------------------------   Pt reports that she has been having right hip pain. She reports that it I worse when she is stepping up on steps or when she have on tight pants.   No Known Allergies   Current Outpatient Prescriptions:  .  aspirin EC 81 MG tablet, Take 81 mg by mouth., Disp: , Rfl:  .  calcium carbonate (CALCIUM 600) 600 MG TABS tablet, Take by mouth., Disp: , Rfl:  .  fluticasone (FLONASE) 50 MCG/ACT nasal spray, USE ONE TO TWO SPRAY(S) IN EACH NOSTRIL ONCE DAILY, Disp: 16 g, Rfl: 12 .  lisinopril (PRINIVIL,ZESTRIL) 10 MG tablet, TAKE ONE TABLET BY MOUTH ONCE DAILY, Disp: 90 tablet, Rfl: 3 .  MULTIPLE VITAMIN PO, Take by mouth., Disp: , Rfl:  .  Omega-3 Fatty Acids (FISH  OIL) 1200 MG CAPS, Take by mouth., Disp: , Rfl:  .  Alpha-Lipoic Acid 200 MG CAPS, Take by mouth., Disp: , Rfl:   Review of Systems  Constitutional: Negative.   HENT: Negative.   Eyes: Negative.   Respiratory: Negative.   Cardiovascular: Negative.   Gastrointestinal: Negative.   Endocrine: Negative.   Genitourinary: Negative.   Musculoskeletal: Positive for arthralgias.  Skin: Negative.   Allergic/Immunologic: Negative.   Neurological: Negative.   Hematological: Negative.   Psychiatric/Behavioral: Negative.     Social History  Substance Use Topics  . Smoking status: Former Smoker    Years: 10.00  . Smokeless tobacco: Never Used     Comment: quit 30 years ago.   . Alcohol use No   Objective:   BP 138/80 (BP Location: Left Arm, Patient Position: Sitting, Cuff Size: Normal)   Pulse 80   Temp 97.9 F (36.6 C) (Oral)   Resp 14   Wt 161 lb (73 kg)   BMI 27.21 kg/m  Vitals:   10/01/16 1003  BP: 138/80  Pulse: 80  Resp: 14  Temp: 97.9 F (36.6 C)  TempSrc: Oral  Weight: 161 lb (73 kg)     Physical Exam  Constitutional: She is oriented to person, place, and time. She appears well-developed and well-nourished.  Eyes: Pupils are equal, round, and reactive to light. Conjunctivae and EOM are normal.  Neck: Normal range of motion. Neck supple.  Cardiovascular: Normal rate, regular rhythm, normal heart sounds and intact distal pulses.  Pulmonary/Chest: Effort normal and breath sounds normal.  Musculoskeletal: Normal range of motion. She exhibits no edema.  Negative tennels. No tenderness of right hand  Neurological: She is alert and oriented to person, place, and time. She has normal reflexes.  Skin: Skin is warm and dry.  Psychiatric: She has a normal mood and affect. Her behavior is normal. Judgment and thought content normal.        Assessment & Plan:     1. Essential (primary) hypertension Stable.   2. Hypercholesterolemia without  hypertriglyceridemia   3. Need for influenza vaccination  - Flu vaccine HIGH DOSE PF  4. Osteopenia, unspecified location  - DG Bone Density; Future     HPI, Exam, and A&P Transcribed under the direction and in the presence of Richard L. Cranford Mon, MD  Electronically Signed: Katina Dung, CMA  I have done the exam and reviewed the above chart and it is accurate to the best of my knowledge. Development worker, community has been used in this note in any air is in the dictation or transcription are unintentional.  [name], MD  [place]

## 2016-10-23 ENCOUNTER — Ambulatory Visit
Admission: RE | Admit: 2016-10-23 | Discharge: 2016-10-23 | Disposition: A | Discharge status: Home / Self Care | Payer: Medicare Other | Source: Ambulatory Visit | Attending: Family Medicine | Admitting: Family Medicine

## 2016-10-23 DIAGNOSIS — Z1231 Encounter for screening mammogram for malignant neoplasm of breast: Secondary | ICD-10-CM | POA: Diagnosis not present

## 2016-11-07 ENCOUNTER — Ambulatory Visit
Admission: RE | Admit: 2016-11-07 | Discharge: 2016-11-07 | Disposition: A | Discharge status: Home / Self Care | Payer: Medicare Other | Source: Ambulatory Visit | Attending: Family Medicine | Admitting: Family Medicine

## 2016-11-07 ENCOUNTER — Telehealth: Payer: Self-pay | Admitting: Family Medicine

## 2016-11-07 DIAGNOSIS — Z13820 Encounter for screening for osteoporosis: Secondary | ICD-10-CM | POA: Insufficient documentation

## 2016-11-07 DIAGNOSIS — M8580 Other specified disorders of bone density and structure, unspecified site: Secondary | ICD-10-CM | POA: Diagnosis not present

## 2016-11-07 DIAGNOSIS — Z780 Asymptomatic menopausal state: Secondary | ICD-10-CM | POA: Diagnosis not present

## 2016-11-07 DIAGNOSIS — M8589 Other specified disorders of bone density and structure, multiple sites: Secondary | ICD-10-CM | POA: Diagnosis not present

## 2016-11-07 NOTE — Telephone Encounter (Signed)
Pt is returning call.  CB#[phone]/MW

## 2016-11-13 DIAGNOSIS — M545 Low back pain: Secondary | ICD-10-CM | POA: Diagnosis not present

## 2016-12-10 ENCOUNTER — Ambulatory Visit: Payer: Medicare Other | Admitting: Family Medicine

## 2016-12-10 ENCOUNTER — Encounter: Payer: Self-pay | Admitting: Family Medicine

## 2016-12-10 VITALS — BP 148/70 | HR 77 | Temp 98.3°F | Resp 16 | Wt 165.0 lb

## 2016-12-10 DIAGNOSIS — M545 Low back pain: Secondary | ICD-10-CM

## 2016-12-10 DIAGNOSIS — G8929 Other chronic pain: Secondary | ICD-10-CM

## 2016-12-10 MED ORDER — NAPROXEN 500 MG PO TABS: 500.0000 mg | ORAL_TABLET | Freq: Two times a day (BID) | ORAL | 0 refills | Status: DC

## 2016-12-10 NOTE — Progress Notes (Signed)
Patient: Kaitlyn Zuniga Female    DOB: 11/11/1932   83 y.o.   MRN: [id number] Visit Date: 12/10/2016  Today's Provider: Vernie Murders, PA   Chief Complaint  Patient presents with  . Hip Pain   Subjective:    Hip Pain   Incident onset: end of October. The injury mechanism is unknown. The pain is present in the right hip. The quality of the pain is described as burning and aching. The pain has been constant since onset. She reports no foreign bodies present. Nothing aggravates the symptoms. She has tried acetaminophen (Patient seen at Urgent Care on 11/13/16 and diagnosed with a pulled muscle. They prescribed Prednisone which relieved pain, but the pain started back 3-4 days after ) for the symptoms. The treatment provided moderate relief.   Past Medical History:  Diagnosis Date  . Hypertension    Past Surgical History:  Procedure Laterality Date  . BREAST BIOPSY Right 2006   benign  . BREAST SURGERY Right [id number]   Biopsy   Family History  Problem Relation Age of Onset  . Cancer Mother        lung  . Cancer Father        prostate  . Hypertension Father   . Breast cancer Sister 41   No Known Allergies  Current Outpatient Medications:  .  Alpha-Lipoic Acid 200 MG CAPS, Take by mouth., Disp: , Rfl:  .  aspirin EC 81 MG tablet, Take 81 mg by mouth., Disp: , Rfl:  .  calcium carbonate (CALCIUM 600) 600 MG TABS tablet, Take by mouth., Disp: , Rfl:  .  fluticasone (FLONASE) 50 MCG/ACT nasal spray, USE ONE TO TWO SPRAY(S) IN EACH NOSTRIL ONCE DAILY, Disp: 16 g, Rfl: 12 .  lisinopril (PRINIVIL,ZESTRIL) 10 MG tablet, TAKE ONE TABLET BY MOUTH ONCE DAILY, Disp: 90 tablet, Rfl: 3 .  MULTIPLE VITAMIN PO, Take by mouth., Disp: , Rfl:  .  Omega-3 Fatty Acids (FISH OIL) 1200 MG CAPS, Take by mouth., Disp: , Rfl:   Review of Systems  Constitutional: Negative.   Respiratory: Negative.   Cardiovascular: Negative.   Musculoskeletal: Positive for arthralgias (right hip pain).     Social History   Tobacco Use  . Smoking status: Former Smoker    Years: 10.00  . Smokeless tobacco: Never Used  . Tobacco comment: quit 30 years ago.   Substance Use Topics  . Alcohol use: No    Alcohol/week: 0.0 oz   Objective:   BP (!) 148/70 (BP Location: Right Arm, Patient Position: Sitting, Cuff Size: Normal)   Pulse 77   Temp 98.3 F (36.8 C) (Oral)   Resp 16   Wt 165 lb (74.8 kg)   SpO2 98%   BMI 27.88 kg/m    Physical Exam  Constitutional: She is oriented to person, place, and time. She appears well-developed and well-nourished. No distress.  HENT:  Head: Normocephalic and atraumatic.  Right Ear: Hearing normal.  Left Ear: Hearing normal.  Nose: Nose normal.  Eyes: Conjunctivae and lids are normal. Right eye exhibits no discharge. Left eye exhibits no discharge. No scleral icterus.  Pulmonary/Chest: Effort normal. No respiratory distress.  Musculoskeletal: Normal range of motion. She exhibits tenderness.  Aching discomfort in the right lower back. No numbness or weakness. Occasional aching in the right lateral thigh unassociated with back discomfort. SLR's 90 degrees without pain. Crepitus both knees without swelling.  Neurological: She is alert and oriented to person, place, and  time.  Skin: Skin is intact. No lesion and no rash noted.  Psychiatric: She has a normal mood and affect. Her speech is normal and behavior is normal. Thought content normal.      Assessment & Plan:     1. Chronic right-sided low back pain without sciatica Onset over the past few months. X-ray evaluation at an Urgent Care Clinic showed no acute anomaly. Will treat with NSAID, moist heat applications, Percogesic at night and rehab exercises. May need referral if no better in a couple weeks. - naproxen (NAPROSYN) 500 MG tablet; Take 1 tablet (500 mg total) by mouth 2 (two) times daily with a meal.  Dispense: 30 tablet; Refill: [place], PA  [facility]

## 2016-12-10 NOTE — Patient Instructions (Signed)

## 2017-01-30 ENCOUNTER — Ambulatory Visit (INDEPENDENT_AMBULATORY_CARE_PROVIDER_SITE_OTHER): Payer: Medicare Other | Admitting: Family Medicine

## 2017-01-30 ENCOUNTER — Encounter: Payer: Self-pay | Admitting: Family Medicine

## 2017-01-30 VITALS — BP 136/70 | HR 80 | Temp 97.9°F | Resp 16 | Ht 65.0 in | Wt 162.0 lb

## 2017-01-30 DIAGNOSIS — I10 Essential (primary) hypertension: Secondary | ICD-10-CM | POA: Diagnosis not present

## 2017-01-30 DIAGNOSIS — E7800 Pure hypercholesterolemia, unspecified: Secondary | ICD-10-CM | POA: Diagnosis not present

## 2017-01-30 DIAGNOSIS — G5601 Carpal tunnel syndrome, right upper limb: Secondary | ICD-10-CM | POA: Diagnosis not present

## 2017-01-30 DIAGNOSIS — Z0000 Encounter for general adult medical examination without abnormal findings: Secondary | ICD-10-CM

## 2017-01-30 NOTE — Progress Notes (Signed)
Patient: Kaitlyn Zuniga, Female    DOB: December 16, 1932, 84 y.o.   MRN: [id number] Visit Date: 01/30/2017  Today's Provider: [name], MD   Chief Complaint  Patient presents with  . Medicare Wellness   Subjective:    Annual wellness visit Kaitlyn Zuniga is a 84 y.o. female. She feels well. She reports exercising about 4 times a week. She reports she is sleeping well.  ----------------------------------------------------------- BMD- 11/07/16 osteopenia Mammogram- 10/23/16 negative Colonoscopy- 01/09/01 Dr. Nicolasa Ducking, congested mucosa ulcers in rectum Pap- 01/29/07 Negative    Review of Systems  Constitutional: Negative.   HENT: Positive for hearing loss and rhinorrhea.   Eyes: Negative.   Respiratory: Negative.   Cardiovascular: Negative.   Gastrointestinal: Negative.   Endocrine: Negative.   Genitourinary: Negative.   Musculoskeletal: Positive for arthralgias and myalgias.  Skin: Negative.   Allergic/Immunologic: Negative.   Neurological: Positive for numbness.  Hematological: Bruises/bleeds easily.  Psychiatric/Behavioral: Negative.     Social History   Socioeconomic History  . Marital status: Married    Spouse name: Not on file  . Number of children: Not on file  . Years of education: Not on file  . Highest education level: Not on file  Social Needs  . Financial resource strain: Not on file  . Food insecurity - worry: Not on file  . Food insecurity - inability: Not on file  . Transportation needs - medical: Not on file  . Transportation needs - non-medical: Not on file  Occupational History  . Not on file  Tobacco Use  . Smoking status: Former Smoker    Years: 10.00  . Smokeless tobacco: Never Used  . Tobacco comment: quit 30 years ago.   Substance and Sexual Activity  . Alcohol use: No    Alcohol/week: 0.0 oz  . Drug use: No  . Sexual activity: Not on file  Other Topics Concern  . Not on file  Social History Narrative  . Not on file      Past Medical History:  Diagnosis Date  . Hypertension      Patient Active Problem List   Diagnosis Date Noted  . Dermatofibroma 09/28/2014  . Essential (primary) hypertension 09/28/2014  . Personal history of reproductive and obstetrical problems 09/28/2014  . History of other malignant neoplasm of skin 09/28/2014  . Mild cognitive disorder 09/28/2014  . Bernhardt's paresthesia 09/28/2014  . Arthritis, degenerative 09/28/2014  . Osteopenia 09/28/2014  . Allergic rhinitis, seasonal 09/28/2014  . Hypercholesterolemia without hypertriglyceridemia 09/28/2014  . Carpal tunnel syndrome 09/28/2014  . Meralgia paraesthetica 07/02/2014  . H/O neoplasm 11/02/2013    Past Surgical History:  Procedure Laterality Date  . BREAST BIOPSY Right 2006   benign  . BREAST SURGERY Right [id number]   Biopsy    Her family history includes Breast cancer (age of onset: 56) in her sister; Cancer in her father and mother; Hypertension in her father.      Current Outpatient Medications:  .  Alpha-Lipoic Acid 200 MG CAPS, Take by mouth., Disp: , Rfl:  .  aspirin EC 81 MG tablet, Take 81 mg by mouth., Disp: , Rfl:  .  calcium carbonate (CALCIUM 600) 600 MG TABS tablet, Take by mouth., Disp: , Rfl:  .  fluticasone (FLONASE) 50 MCG/ACT nasal spray, USE ONE TO TWO SPRAY(S) IN EACH NOSTRIL ONCE DAILY, Disp: 16 g, Rfl: 12 .  lisinopril (PRINIVIL,ZESTRIL) 10 MG tablet, TAKE ONE TABLET BY MOUTH ONCE DAILY, Disp: 90 tablet,  Rfl: 3 .  MULTIPLE VITAMIN PO, Take by mouth., Disp: , Rfl:  .  naproxen (NAPROSYN) 500 MG tablet, Take 1 tablet (500 mg total) by mouth 2 (two) times daily with a meal. (Patient taking differently: Take 500 mg by mouth 2 (two) times daily with a meal. ), Disp: 30 tablet, Rfl: 0 .  Omega-3 Fatty Acids (FISH OIL) 1200 MG CAPS, Take by mouth., Disp: , Rfl:   Patient Care Team: Jerrol Banana., MD as PCP - General (Family Medicine)     Objective:   Vitals: BP 136/70 (BP  Location: Left Arm, Patient Position: Sitting, Cuff Size: Normal)   Pulse 80   Temp 97.9 F (36.6 C) (Oral)   Resp 16   Ht 5\' 5"  (1.651 m)   Wt 162 lb (73.5 kg)   BMI 26.96 kg/m   Physical Exam  Constitutional: She is oriented to person, place, and time. She appears well-developed and well-nourished.  HENT:  Head: Normocephalic and atraumatic.  Right Ear: External ear normal.  Left Ear: External ear normal.  Nose: Nose normal.  Mouth/Throat: Oropharynx is clear and moist.  Eyes: Conjunctivae are normal.  Neck: Neck supple. No thyromegaly present.  Cardiovascular: Normal rate, regular rhythm, normal heart sounds and intact distal pulses.  Pulmonary/Chest: Effort normal and breath sounds normal.  Abdominal: Soft.  Musculoskeletal:  Lipoma left shoulder and left knee.  Neurological: She is alert and oriented to person, place, and time.  Skin: Skin is warm and dry.  Very fair skin.  Psychiatric: She has a normal mood and affect. Her behavior is normal. Judgment and thought content normal.    Activities of Daily Living In your present state of health, do you have any difficulty performing the following activities: 01/30/2017 10/01/2016  Hearing? Kaitlyn Zuniga  Vision? N N  Difficulty concentrating or making decisions? N N  Walking or climbing stairs? N Y  Dressing or bathing? N N  Doing errands, shopping? N N  Some recent data might be hidden    Fall Risk Assessment Fall Risk  01/30/2017 10/01/2016 09/29/2015 09/28/2014  Falls in the past year? No No No No     Depression Screen PHQ 2/9 Scores 01/30/2017 10/01/2016 09/29/2015 09/28/2014  PHQ - 2 Score 0 0 0 0    Cognitive Testing - 6-CIT  Correct? Score   What year is it? yes 0 0 or 4  What month is it? yes 0 0 or 3  Memorize:    Kaitlyn Zuniga,  44,  [address],  [place],      What time is it? (within 1 hour) yes 0 0 or 3  Count backwards from 20 yes 0 0, 2, or 4  Name the months of the year yes 0 0, 2, or 4  Repeat name & address  above yes 6 0, 2, 4, 6, 8, or 10       TOTAL SCORE  6/28   Interpretation:  Normal  Normal (0-7) Abnormal (8-28)       Assessment & Plan:     Annual Wellness Visit  Reviewed patient's Family Medical History Reviewed and updated list of patient's medical providers Assessment of cognitive impairment was done Assessed patient's functional ability Established a written schedule for health screening [place] Completed and Reviewed  Exercise Activities and Dietary recommendations Goals    None      Immunization History  Administered Date(s) Administered  . Influenza, High Dose Seasonal PF 09/28/2014, 09/29/2015, 10/01/2016  .  Pneumococcal Conjugate-13 09/24/2013  . Pneumococcal Polysaccharide-23 11/16/1999  . Td 06/06/2004  . Tdap 10/18/2010  . Zoster 01/10/2006    Health Maintenance  Topic Date Due  . TETANUS/TDAP  10/17/2020  . INFLUENZA VACCINE  Completed  . DEXA SCAN  Completed  . PNA vac Low Risk Adult  Completed    Cologard done 2 years ago. Discussed health benefits of physical activity, and encouraged her to engage in regular exercise appropriate for her age and condition.    1. Medicare annual wellness visit, subsequent   2. Essential (primary) hypertension  - CBC with Differential/Platelet - Comprehensive metabolic panel  3. Hypercholesterolemia without hypertriglyceridemia  - TSH - Lipid panel 4.Possible Right CTS Wear cock up wrist splint.   I have done the exam and reviewed the above chart and it is accurate to the best of my knowledge. Development worker, community has been used in this note in any air is in the dictation or transcription are unintentional.  [name], MD  [place]

## 2017-02-12 DIAGNOSIS — G5603 Carpal tunnel syndrome, bilateral upper limbs: Secondary | ICD-10-CM | POA: Diagnosis not present

## 2017-02-14 DIAGNOSIS — D2261 Melanocytic nevi of right upper limb, including shoulder: Secondary | ICD-10-CM | POA: Diagnosis not present

## 2017-02-14 DIAGNOSIS — D2272 Melanocytic nevi of left lower limb, including hip: Secondary | ICD-10-CM | POA: Diagnosis not present

## 2017-02-14 DIAGNOSIS — D225 Melanocytic nevi of trunk: Secondary | ICD-10-CM | POA: Diagnosis not present

## 2017-02-14 DIAGNOSIS — L821 Other seborrheic keratosis: Secondary | ICD-10-CM | POA: Diagnosis not present

## 2017-02-20 DIAGNOSIS — G5603 Carpal tunnel syndrome, bilateral upper limbs: Secondary | ICD-10-CM | POA: Diagnosis not present

## 2017-03-08 DIAGNOSIS — G5601 Carpal tunnel syndrome, right upper limb: Secondary | ICD-10-CM | POA: Diagnosis not present

## 2017-03-22 DIAGNOSIS — H40003 Preglaucoma, unspecified, bilateral: Secondary | ICD-10-CM | POA: Diagnosis not present

## 2017-03-26 ENCOUNTER — Telehealth: Payer: Self-pay | Admitting: Family Medicine

## 2017-03-26 DIAGNOSIS — M069 Rheumatoid arthritis, unspecified: Secondary | ICD-10-CM

## 2017-03-26 NOTE — Telephone Encounter (Signed)
ok 

## 2017-03-26 NOTE — Telephone Encounter (Signed)
Patient wants to be referred to specialist Dr. Percell [place].

## 2017-04-11 DIAGNOSIS — M25562 Pain in left knee: Secondary | ICD-10-CM | POA: Diagnosis not present

## 2017-04-11 DIAGNOSIS — M25561 Pain in right knee: Secondary | ICD-10-CM | POA: Diagnosis not present

## 2017-04-11 DIAGNOSIS — G5601 Carpal tunnel syndrome, right upper limb: Secondary | ICD-10-CM | POA: Diagnosis not present

## 2017-04-11 DIAGNOSIS — M25569 Pain in unspecified knee: Secondary | ICD-10-CM | POA: Insufficient documentation

## 2017-04-11 DIAGNOSIS — M170 Bilateral primary osteoarthritis of knee: Secondary | ICD-10-CM | POA: Diagnosis not present

## 2017-04-11 DIAGNOSIS — G8929 Other chronic pain: Secondary | ICD-10-CM | POA: Diagnosis not present

## 2017-05-02 DIAGNOSIS — G5601 Carpal tunnel syndrome, right upper limb: Secondary | ICD-10-CM | POA: Diagnosis not present

## 2017-05-02 DIAGNOSIS — M170 Bilateral primary osteoarthritis of knee: Secondary | ICD-10-CM | POA: Insufficient documentation

## 2017-05-10 DIAGNOSIS — J019 Acute sinusitis, unspecified: Secondary | ICD-10-CM | POA: Diagnosis not present

## 2017-05-10 DIAGNOSIS — J40 Bronchitis, not specified as acute or chronic: Secondary | ICD-10-CM | POA: Diagnosis not present

## 2017-05-13 DIAGNOSIS — G5603 Carpal tunnel syndrome, bilateral upper limbs: Secondary | ICD-10-CM | POA: Diagnosis not present

## 2017-05-21 ENCOUNTER — Encounter: Payer: Self-pay | Admitting: Physician Assistant

## 2017-05-21 ENCOUNTER — Ambulatory Visit (INDEPENDENT_AMBULATORY_CARE_PROVIDER_SITE_OTHER): Payer: Medicare Other | Admitting: Physician Assistant

## 2017-05-21 VITALS — BP 118/60 | HR 72 | Temp 98.4°F | Resp 16 | Wt 163.0 lb

## 2017-05-21 DIAGNOSIS — L03011 Cellulitis of right finger: Secondary | ICD-10-CM

## 2017-05-21 MED ORDER — MUPIROCIN 2 % EX OINT: TOPICAL_OINTMENT | CUTANEOUS | 0 refills | Status: DC

## 2017-05-21 NOTE — Progress Notes (Signed)
Patient: Kaitlyn Zuniga Female    DOB: 07/28/1932   84 y.o.   MRN: [id number] Visit Date: 05/21/2017  Today's Provider: Trinna Post, PA-C   Chief Complaint  Patient presents with  . Finger Injury    Infected right finger nail.    Subjective:    Kaitlyn Zuniga is an 84 year old woman who presents with a fingernail infection. The issue is located on her right index finger. There is an area of redness and collection of green pus. This is right around the nail bed. It has been ongoing for a week. At one time, some pus came out. She denies injury, fever, chills, nausea, vomiting.   Hand Pain   The pain is present in the right fingers. Nothing aggravates the symptoms.       No Known Allergies   Current Outpatient Medications:  .  Alpha-Lipoic Acid 200 MG CAPS, Take by mouth., Disp: , Rfl:  .  aspirin EC 81 MG tablet, Take 81 mg by mouth., Disp: , Rfl:  .  calcium carbonate (CALCIUM 600) 600 MG TABS tablet, Take by mouth., Disp: , Rfl:  .  fluticasone (FLONASE) 50 MCG/ACT nasal spray, USE ONE TO TWO SPRAY(S) IN EACH NOSTRIL ONCE DAILY, Disp: 16 g, Rfl: 12 .  lisinopril (PRINIVIL,ZESTRIL) 10 MG tablet, TAKE ONE TABLET BY MOUTH ONCE DAILY, Disp: 90 tablet, Rfl: 3 .  MULTIPLE VITAMIN PO, Take by mouth., Disp: , Rfl:  .  naproxen (NAPROSYN) 500 MG tablet, Take 1 tablet (500 mg total) by mouth 2 (two) times daily with a meal. (Patient taking differently: Take 500 mg by mouth 2 (two) times daily with a meal. ), Disp: 30 tablet, Rfl: 0 .  Omega-3 Fatty Acids (FISH OIL) 1200 MG CAPS, Take by mouth., Disp: , Rfl:   Review of Systems  Constitutional: Negative.   Skin: Positive for wound. Negative for color change, pallor and rash.    Social History   Tobacco Use  . Smoking status: Former Smoker    Years: 10.00  . Smokeless tobacco: Never Used  . Tobacco comment: quit 30 years ago.   Substance Use Topics  . Alcohol use: No    Alcohol/week: 0.0 oz   Objective:   BP  118/60 (BP Location: Right Arm, Patient Position: Sitting, Cuff Size: Normal)   Pulse 72   Temp 98.4 F (36.9 C) (Oral)   Resp 16   Wt 163 lb (73.9 kg)   BMI 27.12 kg/m  Vitals:   05/21/17 0938  BP: 118/60  Pulse: 72  Resp: 16  Temp: 98.4 F (36.9 C)  TempSrc: Oral  Weight: 163 lb (73.9 kg)     Physical Exam  Constitutional: She is oriented to person, place, and time. She appears well-developed and well-nourished.  Cardiovascular: Normal rate and regular rhythm.  Pulses:      Radial pulses are 2+ on the right side, and 2+ on the left side.  Pulmonary/Chest: Effort normal.  Musculoskeletal:       Hands: Neurological: She is alert and oriented to person, place, and time.  Skin: Skin is warm and dry. There is erythema.        Assessment & Plan:     1. Paronychia of finger, right  Antibacterial ointment and warm water soaks daily. Call back if not improving or worsening and will send in oral antibiotics.  - mupirocin ointment (BACTROBAN) 2 %; Apply twice daily to affected finger.  Dispense:  22 g; Refill: 0  Return if symptoms worsen or fail to improve.  The entirety of the information documented in the History of Present Illness, Review of Systems and Physical Exam were personally obtained by me. Portions of this information were initially documented by Ashley Royalty, CMA and reviewed by me for thoroughness and accuracy.         Trinna Post, PA-C  [facility]

## 2017-05-21 NOTE — Patient Instructions (Signed)
Paronychia  Paronychia is an infection of the skin. It happens near a fingernail or toenail. It may cause pain and swelling around the nail. Usually, it is not serious and it clears up with treatment.  Follow these instructions at home:   Soak the fingers or toes in warm water as told by your doctor. You may be told to do this for 20 minutes, 2-3 times a day.   Keep the area dry when you are not soaking it.   Take medicines only as told by your doctor.   If you were given an antibiotic medicine, finish all of it even if you start to feel better.   Keep the affected area clean.   Do not try to drain a fluid-filled bump yourself.   Wear rubber gloves when putting your hands in water.   Wear gloves if your hands might touch cleaners or chemicals.   Follow your doctor's instructions about:  ? Wound care.  ? Bandage (dressing) changes and removal.  Contact a doctor if:   Your symptoms get worse or do not improve.   You have a fever or chills.   You have redness spreading from the affected area.   You have more fluid, blood, or pus coming from the affected area.   Your finger or knuckle is swollen or is hard to move.  This information is not intended to replace advice given to you by your health care provider. Make sure you discuss any questions you have with your health care provider.  Document Released: 12/13/2008 Document Revised: 06/02/2015 Document Reviewed: 12/02/2013  Elsevier Interactive Patient Education  2018 [employer]

## 2017-06-04 DIAGNOSIS — L608 Other nail disorders: Secondary | ICD-10-CM | POA: Diagnosis not present

## 2017-06-04 DIAGNOSIS — S51812A Laceration without foreign body of left forearm, initial encounter: Secondary | ICD-10-CM | POA: Diagnosis not present

## 2017-06-11 ENCOUNTER — Ambulatory Visit: Payer: Medicare Other | Admitting: Family Medicine

## 2017-06-11 VITALS — BP 132/58 | HR 82 | Temp 97.6°F | Resp 14 | Wt 163.0 lb

## 2017-06-11 DIAGNOSIS — I10 Essential (primary) hypertension: Secondary | ICD-10-CM | POA: Diagnosis not present

## 2017-06-11 DIAGNOSIS — M545 Low back pain: Secondary | ICD-10-CM | POA: Diagnosis not present

## 2017-06-11 MED ORDER — MELOXICAM 7.5 MG PO TABS: 7.5000 mg | ORAL_TABLET | Freq: Two times a day (BID) | ORAL | 1 refills | Status: DC | PRN

## 2017-06-11 NOTE — Progress Notes (Signed)
Kaitlyn Zuniga  MRN: [id number] DOB: [birth year]  Subjective:  HPI   The patient is an 84 year old female who presents for evaluation of her back pain.  She states that for 2 weeks she has had pain in her back.  She has a history of this and was treated with NSAIDs.   She states she had x-rays at that time through the urgent care clinic. She states that it radiates down her leg only on occasion.  Patient Active Problem List   Diagnosis Date Noted  . Dermatofibroma 09/28/2014  . Essential (primary) hypertension 09/28/2014  . Personal history of reproductive and obstetrical problems 09/28/2014  . History of other malignant neoplasm of skin 09/28/2014  . Mild cognitive disorder 09/28/2014  . Bernhardt's paresthesia 09/28/2014  . Arthritis, degenerative 09/28/2014  . Osteopenia 09/28/2014  . Allergic rhinitis, seasonal 09/28/2014  . Hypercholesterolemia without hypertriglyceridemia 09/28/2014  . Carpal tunnel syndrome 09/28/2014  . Meralgia paraesthetica 07/02/2014  . H/O neoplasm 11/02/2013    Past Medical History:  Diagnosis Date  . Hypertension     Social History   Socioeconomic History  . Marital status: Married    Spouse name: Not on file  . Number of children: Not on file  . Years of education: Not on file  . Highest education level: Not on file  Occupational History  . Not on file  Social Needs  . Financial resource strain: Not on file  . Food insecurity:    Worry: Not on file    Inability: Not on file  . Transportation needs:    Medical: Not on file    Non-medical: Not on file  Tobacco Use  . Smoking status: Former Smoker    Years: 10.00  . Smokeless tobacco: Never Used  . Tobacco comment: quit 30 years ago.   Substance and Sexual Activity  . Alcohol use: No    Alcohol/week: 0.0 oz  . Drug use: No  . Sexual activity: Not on file  Lifestyle  . Physical activity:    Days per week: Not on file    Minutes per session: Not on file  . Stress: Not on  file  Relationships  . Social connections:    Talks on phone: Not on file    Gets together: Not on file    Attends religious service: Not on file    Active member of club or organization: Not on file    Attends meetings of clubs or organizations: Not on file    Relationship status: Not on file  . Intimate partner violence:    Fear of current or ex partner: Not on file    Emotionally abused: Not on file    Physically abused: Not on file    Forced sexual activity: Not on file  Other Topics Concern  . Not on file  Social History Narrative  . Not on file    Outpatient Encounter Medications as of 06/11/2017  Medication Sig  . Alpha-Lipoic Acid 200 MG CAPS Take by mouth.  Marland Kitchen aspirin EC 81 MG tablet Take 81 mg by mouth.  . calcium carbonate (CALCIUM 600) 600 MG TABS tablet Take by mouth.  . fluticasone (FLONASE) 50 MCG/ACT nasal spray USE ONE TO TWO SPRAY(S) IN EACH NOSTRIL ONCE DAILY  . lisinopril (PRINIVIL,ZESTRIL) 10 MG tablet TAKE ONE TABLET BY MOUTH ONCE DAILY  . MULTIPLE VITAMIN PO Take by mouth.  . naproxen (NAPROSYN) 500 MG tablet Take 1 tablet (500 mg total) by mouth 2 (  two) times daily with a meal. (Patient taking differently: Take 500 mg by mouth 2 (two) times daily with a meal. )  . Omega-3 Fatty Acids (FISH OIL) 1200 MG CAPS Take by mouth.  . [DISCONTINUED] mupirocin ointment (BACTROBAN) 2 % Apply twice daily to affected finger.   No facility-administered encounter medications on file as of 06/11/2017.     No Known Allergies  Review of Systems  Constitutional: Negative.   Eyes: Negative.   Respiratory: Negative for cough, shortness of breath and wheezing.   Cardiovascular: Negative for chest pain, palpitations and leg swelling.  Gastrointestinal: Negative.   Musculoskeletal: Positive for back pain. Negative for joint pain.  Skin: Negative.   Neurological: Negative.   Endo/Heme/Allergies: Negative.   Psychiatric/Behavioral: Negative.     Objective:  BP (!) 132/58 (BP  Location: Right Arm, Patient Position: Sitting, Cuff Size: Normal)   Pulse 82   Temp 97.6 F (36.4 C) (Oral)   Resp 14   Wt 163 lb (73.9 kg)   BMI 27.12 kg/m   Physical Exam  Constitutional: She is oriented to person, place, and time and well-developed, well-nourished, and in no distress.  HENT:  Head: Normocephalic and atraumatic.  Eyes: Conjunctivae are normal. No scleral icterus.  Neck: No thyromegaly present.  Cardiovascular: Normal rate, regular rhythm and normal heart sounds.  Pulmonary/Chest: Effort normal and breath sounds normal.  Abdominal: Soft.  Neurological: She is alert and oriented to person, place, and time. Gait normal. GCS score is 15.  Skin: Skin is warm and dry.  Psychiatric: Mood, memory, affect and judgment normal.    Assessment and Plan :  1. Acute right-sided low back pain, with sciatica presence unspecified SI joint area.No sciatica.May need PT or pain clinic referral. 2.HTN 3.MCI  I have reviewed the health advisors note, was  available for consultation and I agree with documentation and plan. Miguel Aschoff MD [facility]

## 2017-07-02 ENCOUNTER — Other Ambulatory Visit: Payer: Self-pay | Admitting: Family Medicine

## 2017-07-03 ENCOUNTER — Ambulatory Visit
Admission: RE | Admit: 2017-07-03 | Discharge: 2017-07-03 | Disposition: A | Discharge status: Home / Self Care | Payer: Medicare Other | Source: Ambulatory Visit | Attending: Family Medicine | Admitting: Family Medicine

## 2017-07-03 ENCOUNTER — Ambulatory Visit (INDEPENDENT_AMBULATORY_CARE_PROVIDER_SITE_OTHER): Payer: Medicare Other | Admitting: Family Medicine

## 2017-07-03 VITALS — BP 160/80 | HR 74 | Temp 98.1°F | Resp 16 | Wt 161.0 lb

## 2017-07-03 DIAGNOSIS — M25551 Pain in right hip: Secondary | ICD-10-CM

## 2017-07-03 DIAGNOSIS — R109 Unspecified abdominal pain: Secondary | ICD-10-CM | POA: Diagnosis not present

## 2017-07-03 DIAGNOSIS — M160 Bilateral primary osteoarthritis of hip: Secondary | ICD-10-CM | POA: Diagnosis not present

## 2017-07-03 DIAGNOSIS — I10 Essential (primary) hypertension: Secondary | ICD-10-CM

## 2017-07-03 DIAGNOSIS — E7800 Pure hypercholesterolemia, unspecified: Secondary | ICD-10-CM | POA: Diagnosis not present

## 2017-07-03 DIAGNOSIS — F09 Unspecified mental disorder due to known physiological condition: Secondary | ICD-10-CM | POA: Diagnosis not present

## 2017-07-03 LAB — POCT URINALYSIS DIPSTICK
BILIRUBIN UA: NEGATIVE
Glucose, UA: NEGATIVE
KETONES UA: NEGATIVE
Nitrite, UA: NEGATIVE
PROTEIN UA: NEGATIVE
Spec Grav, UA: 1.015 (ref 1.010–1.025)
Urobilinogen, UA: 0.2 E.U./dL
pH, UA: 6.5 (ref 5.0–8.0)

## 2017-07-03 MED ORDER — LISINOPRIL 20 MG PO TABS: 20.0000 mg | ORAL_TABLET | Freq: Every day | ORAL | 3 refills | Status: DC

## 2017-07-03 NOTE — Progress Notes (Signed)
Kaitlyn Zuniga  MRN: [id number] DOB: 08-24-1932  Subjective:  HPI   The patient is an 84 year old female who presents for follow up of chronic health.  She was seen on 01/30/17 for her Medicare wellness exam.  She did not have her labs done at that time as we had ordered. Since that time she has had 2 acute visits in the office.  The last visit was at the beginning of this month.  She complained of right hip/side pain.  She states she is still bothered with the pain and last night she was not able to go to sleep until 2 am. She has pain with weight bearing. Hypertension BP Readings from Last 3 Encounters:  07/03/17 (!) 160/80  06/11/17 (!) 132/58  05/21/17 118/60   Hypercholesterolemia- Lab Results  Component Value Date   CHOL 178 03/29/2016   HDL 44 03/29/2016   LDLCALC 103 (H) 03/29/2016   TRIG 157 (H) 03/29/2016   Mild cognitive impairment MMSE performed today and she scored 29/30.  Patient Active Problem List   Diagnosis Date Noted  . Dermatofibroma 09/28/2014  . Essential (primary) hypertension 09/28/2014  . Personal history of reproductive and obstetrical problems 09/28/2014  . History of other malignant neoplasm of skin 09/28/2014  . Mild cognitive disorder 09/28/2014  . Bernhardt's paresthesia 09/28/2014  . Arthritis, degenerative 09/28/2014  . Osteopenia 09/28/2014  . Allergic rhinitis, seasonal 09/28/2014  . Hypercholesterolemia without hypertriglyceridemia 09/28/2014  . Carpal tunnel syndrome 09/28/2014  . Meralgia paraesthetica 07/02/2014  . H/O neoplasm 11/02/2013    Past Medical History:  Diagnosis Date  . Hypertension     Social History   Socioeconomic History  . Marital status: Married    Spouse name: Not on file  . Number of children: Not on file  . Years of education: Not on file  . Highest education level: Not on file  Occupational History  . Not on file  Social Needs  . Financial resource strain: Not on file  . Food insecurity:   Worry: Not on file    Inability: Not on file  . Transportation needs:    Medical: Not on file    Non-medical: Not on file  Tobacco Use  . Smoking status: Former Smoker    Years: 10.00  . Smokeless tobacco: Never Used  . Tobacco comment: quit 30 years ago.   Substance and Sexual Activity  . Alcohol use: No    Alcohol/week: 0.0 oz  . Drug use: No  . Sexual activity: Not on file  Lifestyle  . Physical activity:    Days per week: Not on file    Minutes per session: Not on file  . Stress: Not on file  Relationships  . Social connections:    Talks on phone: Not on file    Gets together: Not on file    Attends religious service: Not on file    Active member of club or organization: Not on file    Attends meetings of clubs or organizations: Not on file    Relationship status: Not on file  . Intimate partner violence:    Fear of current or ex partner: Not on file    Emotionally abused: Not on file    Physically abused: Not on file    Forced sexual activity: Not on file  Other Topics Concern  . Not on file  Social History Narrative  . Not on file    Outpatient Encounter Medications as of 07/03/2017  Medication Sig  . Alpha-Lipoic Acid 200 MG CAPS Take by mouth.  Marland Kitchen aspirin EC 81 MG tablet Take 81 mg by mouth.  . calcium carbonate (CALCIUM 600) 600 MG TABS tablet Take by mouth.  . fluticasone (FLONASE) 50 MCG/ACT nasal spray USE ONE TO TWO SPRAY(S) IN EACH NOSTRIL ONCE DAILY  . lisinopril (PRINIVIL,ZESTRIL) 10 MG tablet TAKE 1 TABLET BY MOUTH ONCE DAILY  . meloxicam (MOBIC) 7.5 MG tablet Take 1 tablet (7.5 mg total) by mouth 2 (two) times daily as needed for pain.  Marland Kitchen MULTIPLE VITAMIN PO Take by mouth.  . naproxen (NAPROSYN) 500 MG tablet Take 1 tablet (500 mg total) by mouth 2 (two) times daily with a meal. (Patient taking differently: Take 500 mg by mouth 2 (two) times daily with a meal. )  . Omega-3 Fatty Acids (FISH OIL) 1200 MG CAPS Take by mouth.   No facility-administered  encounter medications on file as of 07/03/2017.     No Known Allergies  Review of Systems  Constitutional: Negative for fever and malaise/fatigue.  Eyes: Negative.   Respiratory: Negative for cough, shortness of breath and wheezing.   Cardiovascular: Negative for chest pain, palpitations, orthopnea, claudication and PND.  Gastrointestinal: Negative.   Musculoskeletal: Positive for myalgias (hip pain).  Skin: Negative.   Endo/Heme/Allergies: Negative.   Psychiatric/Behavioral: Negative.     Objective:  BP (!) 160/80 (BP Location: Right Arm, Patient Position: Sitting, Cuff Size: Normal)   Pulse 74   Temp 98.1 F (36.7 C) (Oral)   Resp 16   Wt 161 lb (73 kg)   BMI 26.79 kg/m   Physical Exam  Constitutional: She is oriented to person, place, and time and well-developed, well-nourished, and in no distress.  HENT:  Head: Normocephalic and atraumatic.  Right Ear: External ear normal.  Left Ear: External ear normal.  Nose: Nose normal.  Mouth/Throat: Oropharynx is clear and moist.  Eyes: No scleral icterus.  Neck: No thyromegaly present.  Cardiovascular: Normal rate, regular rhythm and normal heart sounds.  Pulmonary/Chest: Effort normal and breath sounds normal.  Abdominal: Soft.  Neurological: She is alert and oriented to person, place, and time. Gait normal. GCS score is 15.  Skin: Skin is warm and dry.  Psychiatric: Mood, memory, affect and judgment normal.    Assessment and Plan :  Essential (primary) hypertension  Hypercholesterolemia without hypertriglyceridemia  Mild cognitive disorder Right Hip Pain Xray hip. Increase lisinopril from 10 to 20 mg.  I have done the exam and reviewed the chart and it is accurate to the best of my knowledge. Development worker, community has been used and  any errors in dictation or transcription are unintentional. Miguel Aschoff M.D. [facility]

## 2017-07-04 ENCOUNTER — Telehealth: Payer: Self-pay

## 2017-07-04 LAB — COMPREHENSIVE METABOLIC PANEL
A/G RATIO: 1.9 (ref 1.2–2.2)
ALBUMIN: 4.4 g/dL (ref 3.5–4.7)
ALT: 8 IU/L (ref 0–32)
AST: 18 IU/L (ref 0–40)
Alkaline Phosphatase: 49 IU/L (ref 39–117)
BILIRUBIN TOTAL: 0.5 mg/dL (ref 0.0–1.2)
BUN / CREAT RATIO: 19 (ref 12–28)
BUN: 17 mg/dL (ref 8–27)
CHLORIDE: 102 mmol/L (ref 96–106)
CO2: 22 mmol/L (ref 20–29)
Calcium: 9.2 mg/dL (ref 8.7–10.3)
Creatinine, Ser: 0.88 mg/dL (ref 0.57–1.00)
GFR calc non Af Amer: 61 mL/min/{1.73_m2} (ref >59)
GFR, EST AFRICAN AMERICAN: 70 mL/min/{1.73_m2} (ref >59)
GLOBULIN, TOTAL: 2.3 g/dL (ref 1.5–4.5)
GLUCOSE: 95 mg/dL (ref 65–99)
Potassium: 4.1 mmol/L (ref 3.5–5.2)
SODIUM: 140 mmol/L (ref 134–144)
Total Protein: 6.7 g/dL (ref 6.0–8.5)

## 2017-07-04 LAB — LIPID PANEL WITH LDL/HDL RATIO
CHOLESTEROL TOTAL: 161 mg/dL (ref 100–199)
HDL: 41 mg/dL (ref >39)
LDL Calculated: 92 mg/dL (ref 0–99)
LDl/HDL Ratio: 2.2 ratio (ref 0.0–3.2)
Triglycerides: 141 mg/dL (ref 0–149)
VLDL CHOLESTEROL CAL: 28 mg/dL (ref 5–40)

## 2017-07-04 LAB — CBC WITH DIFFERENTIAL/PLATELET
BASOS ABS: 0 10*3/uL (ref 0.0–0.2)
Basos: 1 %
EOS (ABSOLUTE): 0 10*3/uL (ref 0.0–0.4)
Eos: 1 %
HEMOGLOBIN: 11.8 g/dL (ref 11.1–15.9)
Hematocrit: 35.9 % (ref 34.0–46.6)
Immature Grans (Abs): 0 10*3/uL (ref 0.0–0.1)
Immature Granulocytes: 1 %
Lymphocytes Absolute: 1.4 10*3/uL (ref 0.7–3.1)
Lymphs: 38 %
MCH: 30.3 pg (ref 26.6–33.0)
MCHC: 32.9 g/dL (ref 31.5–35.7)
MCV: 92 fL (ref 79–97)
MONOS ABS: 0.5 10*3/uL (ref 0.1–0.9)
Monocytes: 14 %
NEUTROS ABS: 1.7 10*3/uL (ref 1.4–7.0)
Neutrophils: 45 %
Platelets: 169 10*3/uL (ref 150–450)
RBC: 3.9 x10E6/uL (ref 3.77–5.28)
RDW: 13.2 % (ref 12.3–15.4)
WBC: 3.6 10*3/uL (ref 3.4–10.8)

## 2017-07-04 LAB — TSH: TSH: 2.02 u[IU]/mL (ref 0.450–4.500)

## 2017-07-04 NOTE — Telephone Encounter (Signed)
-----   Message from Jerrol Banana., MD sent at 07/03/2017  4:59 PM EDT ----- Mild arthritic changes.

## 2017-07-04 NOTE — Telephone Encounter (Signed)
Tried calling patient. Left message to call back. 

## 2017-07-05 ENCOUNTER — Telehealth: Payer: Self-pay

## 2017-07-05 LAB — SPECIMEN STATUS REPORT

## 2017-07-05 LAB — URINE CULTURE

## 2017-07-05 NOTE — Telephone Encounter (Signed)
Tried calling patient. Left message to call back. 

## 2017-07-05 NOTE — Telephone Encounter (Signed)
-----   Message from Jerrol Banana., MD sent at 07/05/2017 11:42 AM EDT ----- Urine OK.

## 2017-07-08 NOTE — Telephone Encounter (Signed)
LMTCB 07/08/2017  Thanks,   -Circe Chilton  

## 2017-07-12 ENCOUNTER — Telehealth: Payer: Self-pay | Admitting: Family Medicine

## 2017-07-12 NOTE — Telephone Encounter (Signed)
Patient advised.

## 2017-07-12 NOTE — Telephone Encounter (Signed)
Patient is returning Roshena's Call. Please return patient's call @ [phone]. Thanks CC

## 2017-08-31 ENCOUNTER — Other Ambulatory Visit: Payer: Self-pay | Admitting: Family Medicine

## 2017-09-16 ENCOUNTER — Other Ambulatory Visit: Payer: Self-pay | Admitting: Family Medicine

## 2017-09-16 DIAGNOSIS — Z1231 Encounter for screening mammogram for malignant neoplasm of breast: Secondary | ICD-10-CM

## 2017-09-30 DIAGNOSIS — H40003 Preglaucoma, unspecified, bilateral: Secondary | ICD-10-CM | POA: Diagnosis not present

## 2017-10-24 ENCOUNTER — Ambulatory Visit
Admission: RE | Admit: 2017-10-24 | Discharge: 2017-10-24 | Disposition: A | Discharge status: Home / Self Care | Payer: Medicare Other | Source: Ambulatory Visit | Attending: Family Medicine | Admitting: Family Medicine

## 2017-10-24 DIAGNOSIS — Z1231 Encounter for screening mammogram for malignant neoplasm of breast: Secondary | ICD-10-CM | POA: Insufficient documentation

## 2017-11-01 DIAGNOSIS — G8929 Other chronic pain: Secondary | ICD-10-CM | POA: Diagnosis not present

## 2017-11-01 DIAGNOSIS — G5601 Carpal tunnel syndrome, right upper limb: Secondary | ICD-10-CM | POA: Diagnosis not present

## 2017-11-01 DIAGNOSIS — M533 Sacrococcygeal disorders, not elsewhere classified: Secondary | ICD-10-CM | POA: Diagnosis not present

## 2017-11-02 DIAGNOSIS — I10 Essential (primary) hypertension: Secondary | ICD-10-CM | POA: Diagnosis not present

## 2017-11-02 DIAGNOSIS — S0086XA Insect bite (nonvenomous) of other part of head, initial encounter: Secondary | ICD-10-CM | POA: Diagnosis not present

## 2017-11-13 DIAGNOSIS — G8929 Other chronic pain: Secondary | ICD-10-CM | POA: Diagnosis not present

## 2017-11-13 DIAGNOSIS — M533 Sacrococcygeal disorders, not elsewhere classified: Secondary | ICD-10-CM | POA: Diagnosis not present

## 2017-11-19 ENCOUNTER — Encounter: Payer: Self-pay | Admitting: Family Medicine

## 2017-11-19 ENCOUNTER — Ambulatory Visit: Payer: Medicare Other | Admitting: Family Medicine

## 2017-11-19 VITALS — BP 138/74 | HR 68 | Temp 98.3°F | Resp 16 | Wt 160.0 lb

## 2017-11-19 DIAGNOSIS — W57XXXA Bitten or stung by nonvenomous insect and other nonvenomous arthropods, initial encounter: Secondary | ICD-10-CM

## 2017-11-19 DIAGNOSIS — L03211 Cellulitis of face: Secondary | ICD-10-CM | POA: Diagnosis not present

## 2017-11-19 MED ORDER — CEPHALEXIN 500 MG PO CAPS: 500.0000 mg | ORAL_CAPSULE | Freq: Two times a day (BID) | ORAL | 0 refills | Status: AC

## 2017-11-19 NOTE — Patient Instructions (Signed)
Benadryl 25mg  every 4 hours as needed for itching.

## 2017-11-19 NOTE — Progress Notes (Signed)
Patient: Kaitlyn Zuniga Female    DOB: April 01, 1932   84 y.o.   MRN: [id number] Visit Date: 11/19/2017  Today's Provider: [name], MD   Chief Complaint  Patient presents with  . Insect Bite   Subjective:    HPI Patient comes in today for a bug bite that happened yesterday. The bite is located on her right eyelid and it has redness and beginning to swell. She reports that she was mowing her yard and felt a bite on her eye and it immediately started to itch. She reports that her eye was swollen by the end of the night. She reports that she had this before about 1 month ago, and she was seen at urgent care. She reports that her symptoms were resolved with antibiotics.     No Known Allergies   Current Outpatient Medications:  .  Alpha-Lipoic Acid 200 MG CAPS, Take by mouth., Disp: , Rfl:  .  aspirin EC 81 MG tablet, Take 81 mg by mouth., Disp: , Rfl:  .  calcium carbonate (CALCIUM 600) 600 MG TABS tablet, Take by mouth., Disp: , Rfl:  .  fluticasone (FLONASE) 50 MCG/ACT nasal spray, USE ONE TO TWO SPRAY(S) IN EACH NOSTRIL ONCE DAILY, Disp: 16 g, Rfl: 12 .  lisinopril (PRINIVIL,ZESTRIL) 20 MG tablet, Take 1 tablet (20 mg total) by mouth daily., Disp: 90 tablet, Rfl: 3 .  meloxicam (MOBIC) 7.5 MG tablet, TAKE 1 TABLET BY MOUTH TWICE DAILY AS NEEDED FOR PAIN, Disp: 60 tablet, Rfl: 1 .  MULTIPLE VITAMIN PO, Take by mouth., Disp: , Rfl:  .  naproxen (NAPROSYN) 500 MG tablet, Take 1 tablet (500 mg total) by mouth 2 (two) times daily with a meal. (Patient taking differently: Take 500 mg by mouth 2 (two) times daily with a meal. ), Disp: 30 tablet, Rfl: 0 .  Omega-3 Fatty Acids (FISH OIL) 1200 MG CAPS, Take by mouth., Disp: , Rfl:   Review of Systems  Constitutional: Negative for activity change, appetite change, chills, diaphoresis, fatigue, fever and unexpected weight change.  Eyes: Positive for itching. Negative for photophobia, pain, discharge, redness and visual  disturbance.       Swelling and tenderness of right eyelid and periorbital area.  Respiratory: Negative for cough and shortness of breath.   Cardiovascular: Negative for chest pain, palpitations and leg swelling.  Musculoskeletal: Negative for myalgias, neck pain and neck stiffness.  Skin: Positive for color change, rash and wound.  Neurological: Negative for dizziness, light-headedness and headaches.  Hematological: Does not bruise/bleed easily.  Psychiatric/Behavioral: Negative for agitation. The patient is not nervous/anxious.     Social History   Tobacco Use  . Smoking status: Former Smoker    Years: 10.00  . Smokeless tobacco: Never Used  . Tobacco comment: quit 30 years ago.   Substance Use Topics  . Alcohol use: No    Alcohol/week: 0.0 standard drinks   Objective:   BP 138/74 (BP Location: Left Arm, Patient Position: Sitting, Cuff Size: Normal)   Pulse 68   Temp 98.3 F (36.8 C)   Resp 16   Wt 160 lb (72.6 kg)   SpO2 98%   BMI 26.63 kg/m  Vitals:   11/19/17 1114  BP: 138/74  Pulse: 68  Resp: 16  Temp: 98.3 F (36.8 C)  SpO2: 98%  Weight: 160 lb (72.6 kg)     Physical Exam  Constitutional: She is oriented to person, place, and time. She appears  well-developed and well-nourished.  HENT:  Head: Normocephalic and atraumatic.  Right Ear: External ear normal.  Left Ear: External ear normal.  Nose: Nose normal.  Eyes: Conjunctivae are normal.  Neck: No thyromegaly present.  Cardiovascular: Normal rate, regular rhythm and normal heart sounds.  Pulmonary/Chest: Effort normal and breath sounds normal.  Abdominal: Soft.  Musculoskeletal: She exhibits no edema.  Neurological: She is alert and oriented to person, place, and time.  Skin: Skin is warm and dry.  Psychiatric: She has a normal mood and affect. Her behavior is normal. Judgment and thought content normal.        Assessment & Plan:     1. Bug bite, initial encounter Ice for 2 days then warm  compresses.  2. Cellulitis of face  - cephALEXin (KEFLEX) 500 MG capsule; Take 1 capsule (500 mg total) by mouth 2 (two) times daily for 5 days.  Dispense: 10 capsule; Refill: 0 3.Blepheritis  I have done the exam and reviewed the chart and it is accurate to the best of my knowledge. Development worker, community has been used and  any errors in dictation or transcription are unintentional. Miguel Aschoff M.D. [place], MD  [facility]

## 2017-11-21 DIAGNOSIS — G8929 Other chronic pain: Secondary | ICD-10-CM | POA: Diagnosis not present

## 2017-11-21 DIAGNOSIS — M533 Sacrococcygeal disorders, not elsewhere classified: Secondary | ICD-10-CM | POA: Diagnosis not present

## 2017-11-28 DIAGNOSIS — M533 Sacrococcygeal disorders, not elsewhere classified: Secondary | ICD-10-CM | POA: Diagnosis not present

## 2017-11-28 DIAGNOSIS — G8929 Other chronic pain: Secondary | ICD-10-CM | POA: Diagnosis not present

## 2017-12-04 DIAGNOSIS — G8929 Other chronic pain: Secondary | ICD-10-CM | POA: Diagnosis not present

## 2017-12-04 DIAGNOSIS — M533 Sacrococcygeal disorders, not elsewhere classified: Secondary | ICD-10-CM | POA: Diagnosis not present

## 2017-12-11 DIAGNOSIS — G8929 Other chronic pain: Secondary | ICD-10-CM | POA: Diagnosis not present

## 2017-12-11 DIAGNOSIS — M533 Sacrococcygeal disorders, not elsewhere classified: Secondary | ICD-10-CM | POA: Diagnosis not present

## 2017-12-18 DIAGNOSIS — G8929 Other chronic pain: Secondary | ICD-10-CM | POA: Diagnosis not present

## 2017-12-18 DIAGNOSIS — M533 Sacrococcygeal disorders, not elsewhere classified: Secondary | ICD-10-CM | POA: Diagnosis not present

## 2017-12-25 DIAGNOSIS — M533 Sacrococcygeal disorders, not elsewhere classified: Secondary | ICD-10-CM | POA: Diagnosis not present

## 2017-12-25 DIAGNOSIS — G8929 Other chronic pain: Secondary | ICD-10-CM | POA: Diagnosis not present

## 2017-12-26 ENCOUNTER — Ambulatory Visit: Payer: Medicare Other | Admitting: Family Medicine

## 2017-12-26 VITALS — BP 142/80 | HR 75 | Temp 98.1°F | Resp 16 | Wt 159.0 lb

## 2017-12-26 DIAGNOSIS — M8580 Other specified disorders of bone density and structure, unspecified site: Secondary | ICD-10-CM | POA: Diagnosis not present

## 2017-12-26 DIAGNOSIS — E7800 Pure hypercholesterolemia, unspecified: Secondary | ICD-10-CM

## 2017-12-26 DIAGNOSIS — I10 Essential (primary) hypertension: Secondary | ICD-10-CM

## 2017-12-26 DIAGNOSIS — Z23 Encounter for immunization: Secondary | ICD-10-CM | POA: Diagnosis not present

## 2017-12-26 NOTE — Progress Notes (Signed)
Kaitlyn Zuniga  MRN: [id number] DOB: Apr 05, 1932  Subjective:  HPI   The patient is an 84 year old female who presents for follow up of hypertension.  She was last seen for this on 07/03/17.  Her blood pressure at that time was good but prior to that she had been having elevated readings.   The patient had been getting readings at home that had been running low.  After talking to her pharmacist she cut her pill in half and has been getting readings that range 116-134 over 50's-83.   BP Readings from Last 3 Encounters:  12/26/17 (!) 142/80  11/19/17 138/74  07/03/17 (!) 160/80     Patient Active Problem List   Diagnosis Date Noted  . Dermatofibroma 09/28/2014  . Essential (primary) hypertension 09/28/2014  . Personal history of reproductive and obstetrical problems 09/28/2014  . History of other malignant neoplasm of skin 09/28/2014  . Mild cognitive disorder 09/28/2014  . Bernhardt's paresthesia 09/28/2014  . Arthritis, degenerative 09/28/2014  . Osteopenia 09/28/2014  . Allergic rhinitis, seasonal 09/28/2014  . Hypercholesterolemia without hypertriglyceridemia 09/28/2014  . Carpal tunnel syndrome 09/28/2014  . Meralgia paraesthetica 07/02/2014  . H/O neoplasm 11/02/2013    Past Medical History:  Diagnosis Date  . Hypertension     Social History   Socioeconomic History  . Marital status: Married    Spouse name: Not on file  . Number of children: Not on file  . Years of education: Not on file  . Highest education level: Not on file  Occupational History  . Not on file  Social Needs  . Financial resource strain: Not on file  . Food insecurity:    Worry: Not on file    Inability: Not on file  . Transportation needs:    Medical: Not on file    Non-medical: Not on file  Tobacco Use  . Smoking status: Former Smoker    Years: 10.00  . Smokeless tobacco: Never Used  . Tobacco comment: quit 30 years ago.   Substance and Sexual Activity  . Alcohol use: No   Alcohol/week: 0.0 standard drinks  . Drug use: No  . Sexual activity: Not on file  Lifestyle  . Physical activity:    Days per week: Not on file    Minutes per session: Not on file  . Stress: Not on file  Relationships  . Social connections:    Talks on phone: Not on file    Gets together: Not on file    Attends religious service: Not on file    Active member of club or organization: Not on file    Attends meetings of clubs or organizations: Not on file    Relationship status: Not on file  . Intimate partner violence:    Fear of current or ex partner: Not on file    Emotionally abused: Not on file    Physically abused: Not on file    Forced sexual activity: Not on file  Other Topics Concern  . Not on file  Social History Narrative  . Not on file    Outpatient Encounter Medications as of 12/26/2017  Medication Sig  . aspirin EC 81 MG tablet Take 81 mg by mouth.  . calcium carbonate (CALCIUM 600) 600 MG TABS tablet Take by mouth.  . fluticasone (FLONASE) 50 MCG/ACT nasal spray USE ONE TO TWO SPRAY(S) IN EACH NOSTRIL ONCE DAILY  . lisinopril (PRINIVIL,ZESTRIL) 20 MG tablet Take 1 tablet (20 mg total) by mouth  daily. (Patient taking differently: Take 10 mg by mouth daily. )  . Lysine 500 MG TABS Take by mouth.  . meloxicam (MOBIC) 7.5 MG tablet TAKE 1 TABLET BY MOUTH TWICE DAILY AS NEEDED FOR PAIN  . MULTIPLE VITAMIN PO Take by mouth.  . Omega-3 Fatty Acids (FISH OIL) 1200 MG CAPS Take by mouth.  . [DISCONTINUED] Alpha-Lipoic Acid 200 MG CAPS Take by mouth.  . [DISCONTINUED] naproxen (NAPROSYN) 500 MG tablet Take 1 tablet (500 mg total) by mouth 2 (two) times daily with a meal. (Patient taking differently: Take 500 mg by mouth 2 (two) times daily with a meal. )   No facility-administered encounter medications on file as of 12/26/2017.     No Known Allergies  Review of Systems  Constitutional: Negative for fever and malaise/fatigue.  Eyes: Negative.   Respiratory: Negative  for cough, shortness of breath and wheezing.   Cardiovascular: Negative for chest pain, palpitations, orthopnea, claudication and PND.  Gastrointestinal: Negative.   Genitourinary: Negative.   Musculoskeletal: Positive for myalgias (hip pain).  Skin: Negative.   Endo/Heme/Allergies: Negative.   Psychiatric/Behavioral: Negative.     Objective:  BP (!) 142/80 (BP Location: Right Arm, Patient Position: Sitting, Cuff Size: Normal)   Pulse 75   Temp 98.1 F (36.7 C) (Oral)   Resp 16   Wt 159 lb (72.1 kg)   SpO2 98%   BMI 26.46 kg/m   Physical Exam  Constitutional: She is oriented to person, place, and time and well-developed, well-nourished, and in no distress.  HENT:  Head: Normocephalic and atraumatic.  Right Ear: External ear normal.  Left Ear: External ear normal.  Nose: Nose normal.  Mouth/Throat: Oropharynx is clear and moist.  Eyes: No scleral icterus.  Neck: No thyromegaly present.  Cardiovascular: Normal rate, regular rhythm and normal heart sounds.  Pulmonary/Chest: Effort normal and breath sounds normal.  Abdominal: Soft.  Neurological: She is alert and oriented to person, place, and time. Gait normal. GCS score is 15.  Skin: Skin is warm and dry.  Psychiatric: Mood, memory, affect and judgment normal.    Assessment and Plan :  1. Need for influenza vaccination  - Flu vaccine HIGH DOSE PF (Fluzone High dose)  2. Essential (primary) hypertension Fair control.  3. Hypercholesterolemia without hypertriglyceridemia   4. Osteopenia, unspecified location 5.MCI  I have done the exam and reviewed the chart and it is accurate to the best of my knowledge. Development worker, community has been used and  any errors in dictation or transcription are unintentional. Miguel Aschoff M.D. [facility]

## 2018-01-06 DIAGNOSIS — G8929 Other chronic pain: Secondary | ICD-10-CM | POA: Diagnosis not present

## 2018-01-06 DIAGNOSIS — M170 Bilateral primary osteoarthritis of knee: Secondary | ICD-10-CM | POA: Diagnosis not present

## 2018-01-06 DIAGNOSIS — M25551 Pain in right hip: Secondary | ICD-10-CM | POA: Diagnosis not present

## 2018-02-04 DIAGNOSIS — M7061 Trochanteric bursitis, right hip: Secondary | ICD-10-CM | POA: Diagnosis not present

## 2018-02-04 DIAGNOSIS — M47816 Spondylosis without myelopathy or radiculopathy, lumbar region: Secondary | ICD-10-CM | POA: Diagnosis not present

## 2018-02-04 DIAGNOSIS — M25551 Pain in right hip: Secondary | ICD-10-CM | POA: Diagnosis not present

## 2018-02-04 DIAGNOSIS — G8929 Other chronic pain: Secondary | ICD-10-CM | POA: Diagnosis not present

## 2018-02-11 ENCOUNTER — Other Ambulatory Visit: Payer: Self-pay | Admitting: Internal Medicine

## 2018-02-11 DIAGNOSIS — M25551 Pain in right hip: Secondary | ICD-10-CM

## 2018-02-12 ENCOUNTER — Other Ambulatory Visit: Payer: Self-pay | Admitting: Internal Medicine

## 2018-02-12 DIAGNOSIS — M25551 Pain in right hip: Secondary | ICD-10-CM

## 2018-02-13 DIAGNOSIS — L821 Other seborrheic keratosis: Secondary | ICD-10-CM | POA: Diagnosis not present

## 2018-02-13 DIAGNOSIS — L648 Other androgenic alopecia: Secondary | ICD-10-CM | POA: Diagnosis not present

## 2018-02-13 DIAGNOSIS — L538 Other specified erythematous conditions: Secondary | ICD-10-CM | POA: Diagnosis not present

## 2018-02-13 DIAGNOSIS — B078 Other viral warts: Secondary | ICD-10-CM | POA: Diagnosis not present

## 2018-02-17 ENCOUNTER — Ambulatory Visit: Payer: Medicare Other

## 2018-02-20 ENCOUNTER — Ambulatory Visit
Admission: RE | Admit: 2018-02-20 | Discharge: 2018-02-20 | Disposition: A | Discharge status: Home / Self Care | Payer: Medicare Other | Source: Ambulatory Visit | Attending: Internal Medicine | Admitting: Internal Medicine

## 2018-02-20 DIAGNOSIS — M25551 Pain in right hip: Secondary | ICD-10-CM | POA: Insufficient documentation

## 2018-02-20 DIAGNOSIS — M1611 Unilateral primary osteoarthritis, right hip: Secondary | ICD-10-CM | POA: Diagnosis not present

## 2018-03-10 ENCOUNTER — Other Ambulatory Visit: Payer: Self-pay | Admitting: Family Medicine

## 2018-03-10 DIAGNOSIS — G8929 Other chronic pain: Secondary | ICD-10-CM | POA: Diagnosis not present

## 2018-03-10 DIAGNOSIS — M5442 Lumbago with sciatica, left side: Secondary | ICD-10-CM | POA: Diagnosis not present

## 2018-03-10 DIAGNOSIS — M5416 Radiculopathy, lumbar region: Secondary | ICD-10-CM | POA: Diagnosis not present

## 2018-03-10 DIAGNOSIS — M5136 Other intervertebral disc degeneration, lumbar region: Secondary | ICD-10-CM | POA: Diagnosis not present

## 2018-03-10 DIAGNOSIS — M5417 Radiculopathy, lumbosacral region: Secondary | ICD-10-CM

## 2018-03-19 ENCOUNTER — Ambulatory Visit
Admission: RE | Admit: 2018-03-19 | Discharge: 2018-03-19 | Disposition: A | Discharge status: Home / Self Care | Payer: Medicare Other | Source: Ambulatory Visit | Attending: Family Medicine | Admitting: Family Medicine

## 2018-03-19 ENCOUNTER — Other Ambulatory Visit: Payer: Self-pay

## 2018-03-19 DIAGNOSIS — M5127 Other intervertebral disc displacement, lumbosacral region: Secondary | ICD-10-CM | POA: Diagnosis not present

## 2018-03-19 DIAGNOSIS — M5417 Radiculopathy, lumbosacral region: Secondary | ICD-10-CM | POA: Diagnosis not present

## 2018-03-19 DIAGNOSIS — M48061 Spinal stenosis, lumbar region without neurogenic claudication: Secondary | ICD-10-CM | POA: Diagnosis not present

## 2018-03-19 DIAGNOSIS — M47816 Spondylosis without myelopathy or radiculopathy, lumbar region: Secondary | ICD-10-CM | POA: Diagnosis not present

## 2018-03-21 DIAGNOSIS — G8929 Other chronic pain: Secondary | ICD-10-CM | POA: Diagnosis not present

## 2018-03-21 DIAGNOSIS — M5442 Lumbago with sciatica, left side: Secondary | ICD-10-CM | POA: Diagnosis not present

## 2018-03-21 DIAGNOSIS — M5136 Other intervertebral disc degeneration, lumbar region: Secondary | ICD-10-CM | POA: Diagnosis not present

## 2018-03-21 DIAGNOSIS — M5416 Radiculopathy, lumbar region: Secondary | ICD-10-CM | POA: Diagnosis not present

## 2018-05-14 DIAGNOSIS — G8929 Other chronic pain: Secondary | ICD-10-CM | POA: Diagnosis not present

## 2018-05-14 DIAGNOSIS — M25562 Pain in left knee: Secondary | ICD-10-CM | POA: Diagnosis not present

## 2018-05-14 DIAGNOSIS — M25561 Pain in right knee: Secondary | ICD-10-CM | POA: Diagnosis not present

## 2018-05-14 DIAGNOSIS — G5603 Carpal tunnel syndrome, bilateral upper limbs: Secondary | ICD-10-CM | POA: Diagnosis not present

## 2018-05-29 DIAGNOSIS — M5126 Other intervertebral disc displacement, lumbar region: Secondary | ICD-10-CM | POA: Diagnosis not present

## 2018-05-29 DIAGNOSIS — M5416 Radiculopathy, lumbar region: Secondary | ICD-10-CM | POA: Diagnosis not present

## 2018-05-29 DIAGNOSIS — M48062 Spinal stenosis, lumbar region with neurogenic claudication: Secondary | ICD-10-CM | POA: Diagnosis not present

## 2018-06-13 DIAGNOSIS — S20469A Insect bite (nonvenomous) of unspecified back wall of thorax, initial encounter: Secondary | ICD-10-CM | POA: Diagnosis not present

## 2018-06-13 DIAGNOSIS — I781 Nevus, non-neoplastic: Secondary | ICD-10-CM | POA: Diagnosis not present

## 2018-06-13 DIAGNOSIS — L538 Other specified erythematous conditions: Secondary | ICD-10-CM | POA: Diagnosis not present

## 2018-06-13 DIAGNOSIS — L820 Inflamed seborrheic keratosis: Secondary | ICD-10-CM | POA: Diagnosis not present

## 2018-06-23 ENCOUNTER — Ambulatory Visit (INDEPENDENT_AMBULATORY_CARE_PROVIDER_SITE_OTHER): Payer: Medicare Other | Admitting: Family Medicine

## 2018-06-23 ENCOUNTER — Other Ambulatory Visit: Payer: Self-pay

## 2018-06-23 ENCOUNTER — Encounter: Payer: Self-pay | Admitting: Family Medicine

## 2018-06-23 VITALS — BP 118/56 | HR 68 | Temp 97.6°F | Resp 15 | Wt 158.6 lb

## 2018-06-23 DIAGNOSIS — M5416 Radiculopathy, lumbar region: Secondary | ICD-10-CM

## 2018-06-23 DIAGNOSIS — Z0000 Encounter for general adult medical examination without abnormal findings: Secondary | ICD-10-CM

## 2018-06-23 DIAGNOSIS — I10 Essential (primary) hypertension: Secondary | ICD-10-CM | POA: Diagnosis not present

## 2018-06-23 DIAGNOSIS — F09 Unspecified mental disorder due to known physiological condition: Secondary | ICD-10-CM

## 2018-06-23 DIAGNOSIS — L570 Actinic keratosis: Secondary | ICD-10-CM

## 2018-06-23 DIAGNOSIS — E7800 Pure hypercholesterolemia, unspecified: Secondary | ICD-10-CM

## 2018-06-23 DIAGNOSIS — Z1239 Encounter for other screening for malignant neoplasm of breast: Secondary | ICD-10-CM | POA: Diagnosis not present

## 2018-06-23 DIAGNOSIS — Z1211 Encounter for screening for malignant neoplasm of colon: Secondary | ICD-10-CM

## 2018-06-23 NOTE — Progress Notes (Signed)
Patient: Kaitlyn Zuniga, Female    DOB: 1932-01-13, 85 y.o.   MRN: [id number] Visit Date: 06/23/2018  Today's Provider: [name], Kaitlyn Zuniga   Chief Complaint  Patient presents with  . Annual Exam   Subjective:     Complete Physical Kaitlyn Zuniga is a 85 y.o. female. She feels well. She reports she has stopped exercising due to pinch nerve in her back . She reports she is sleeping fairly well, she reports cramps in her legs often at night.  ----------------------------------------------------------- Last Reported Colonoscopy/Cologuard Screen-01/09/2001 Mammogram-10/24/17 BMD-11/07/16 Tdap- 10/18/10 Pneumo 23- 11/16/99 PV13-09/25/03  Review of Systems  Constitutional: Negative.   HENT: Positive for hearing loss.   Eyes: Negative.   Respiratory: Negative.   Cardiovascular: Negative.   Gastrointestinal: Positive for constipation.  Endocrine: Negative.   Musculoskeletal: Positive for arthralgias and back pain.  Neurological: Negative.   Hematological: Negative.   Psychiatric/Behavioral: Negative.     Social History   Socioeconomic History  . Marital status: Married    Spouse name: Not on file  . Number of children: Not on file  . Years of education: Not on file  . Highest education level: Not on file  Occupational History  . Not on file  Social Needs  . Financial resource strain: Not on file  . Food insecurity    Worry: Not on file    Inability: Not on file  . Transportation needs    Medical: Not on file    Non-medical: Not on file  Tobacco Use  . Smoking status: Former Smoker    Years: 10.00  . Smokeless tobacco: Never Used  . Tobacco comment: quit 30 years ago.   Substance and Sexual Activity  . Alcohol use: No    Alcohol/week: 0.0 standard drinks  . Drug use: No  . Sexual activity: Not on file  Lifestyle  . Physical activity    Days per week: Not on file    Minutes per session: Not on file  . Stress: Not on file  Relationships  .  Social Herbalist on phone: Not on file    Gets together: Not on file    Attends religious service: Not on file    Active member of club or organization: Not on file    Attends meetings of clubs or organizations: Not on file    Relationship status: Not on file  . Intimate partner violence    Fear of current or ex partner: Not on file    Emotionally abused: Not on file    Physically abused: Not on file    Forced sexual activity: Not on file  Other Topics Concern  . Not on file  Social History Narrative  . Not on file    Past Medical History:  Diagnosis Date  . Hypertension      Patient Active Problem List   Diagnosis Date Noted  . Dermatofibroma 09/28/2014  . Essential (primary) hypertension 09/28/2014  . Personal history of reproductive and obstetrical problems 09/28/2014  . History of other malignant neoplasm of skin 09/28/2014  . Mild cognitive disorder 09/28/2014  . Bernhardt's paresthesia 09/28/2014  . Arthritis, degenerative 09/28/2014  . Osteopenia 09/28/2014  . Allergic rhinitis, seasonal 09/28/2014  . Hypercholesterolemia without hypertriglyceridemia 09/28/2014  . Carpal tunnel syndrome 09/28/2014  . Meralgia paraesthetica 07/02/2014  . H/O neoplasm 11/02/2013    Past Surgical History:  Procedure Laterality Date  . BREAST BIOPSY Right 2006   benign  .  BREAST SURGERY Right [id number]   Biopsy    Her family history includes Breast cancer (age of onset: 52) in her sister; Cancer in her father and mother; Hypertension in her father.   Current Outpatient Medications:  .  aspirin EC 81 MG tablet, Take 81 mg by mouth., Disp: , Rfl:  .  calcium carbonate (CALCIUM 600) 600 MG TABS tablet, Take by mouth., Disp: , Rfl:  .  fluticasone (FLONASE) 50 MCG/ACT nasal spray, USE ONE TO TWO SPRAY(S) IN EACH NOSTRIL ONCE DAILY, Disp: 16 g, Rfl: 12 .  lisinopril (PRINIVIL,ZESTRIL) 20 MG tablet, Take 1 tablet (20 mg total) by mouth daily. (Patient taking differently:  Take 10 mg by mouth daily. ), Disp: 90 tablet, Rfl: 3 .  Lysine 500 MG TABS, Take by mouth., Disp: , Rfl:  .  meloxicam (MOBIC) 7.5 MG tablet, TAKE 1 TABLET BY MOUTH TWICE DAILY AS NEEDED FOR PAIN, Disp: 60 tablet, Rfl: 1 .  MULTIPLE VITAMIN PO, Take by mouth., Disp: , Rfl:  .  Omega-3 Fatty Acids (FISH OIL) 1200 MG CAPS, Take by mouth., Disp: , Rfl:   Patient Care Team: Jerrol Banana., Kaitlyn Zuniga as PCP - General (Family Medicine)     Objective:    Vitals: There were no vitals taken for this visit.  Physical Exam Vitals signs reviewed.  Constitutional:      Appearance: She is well-developed.  HENT:     Head: Normocephalic and atraumatic.     Right Ear: External ear normal.     Left Ear: External ear normal.     Nose: Nose normal.  Eyes:     Conjunctiva/sclera: Conjunctivae normal.  Neck:     Musculoskeletal: Neck supple.     Thyroid: No thyromegaly.  Cardiovascular:     Rate and Rhythm: Normal rate and regular rhythm.     Heart sounds: Normal heart sounds.  Pulmonary:     Effort: Pulmonary effort is normal.     Breath sounds: Normal breath sounds.  Chest:     Breasts:        Right: Normal.        Left: Normal.  Abdominal:     Palpations: Abdomen is soft.  Genitourinary:    General: Normal vulva.     Rectum: Guaiac result negative.     Comments: Bimanual and DRE normal. Skin:    General: Skin is warm and dry.     Comments: Very fair skin.  Neurological:     Mental Status: She is alert and oriented to person, place, and time. Mental status is at baseline.  Psychiatric:        Mood and Affect: Mood normal.        Behavior: Behavior normal.        Thought Content: Thought content normal.        Judgment: Judgment normal.     Activities of Daily Living In your present state of health, do you have any difficulty performing the following activities: 07/03/2017  Hearing? Y  Vision? N  Difficulty concentrating or making decisions? N  Walking or climbing stairs? N   Dressing or bathing? N  Doing errands, shopping? N  Some recent data might be hidden    Fall Risk Assessment Fall Risk  07/03/2017 01/30/2017 10/01/2016 09/29/2015 09/28/2014  Falls in the past year? No No No No No     Depression Screen PHQ 2/9 Scores 07/03/2017 01/30/2017 10/01/2016 09/29/2015  PHQ - 2 Score 0 0 0 0  No flowsheet data found.     Assessment & Plan:    Annual Physical Reviewed patient's Family Medical History Reviewed and updated list of patient's medical providers Assessment of cognitive impairment was done Assessed patient's functional ability Established a written schedule for health screening [place] Completed and Reviewed  Exercise Activities and Dietary recommendations Goals   None     Immunization History  Administered Date(s) Administered  . Influenza, High Dose Seasonal PF 09/28/2014, 09/29/2015, 10/01/2016, 12/26/2017  . Pneumococcal Conjugate-13 09/24/2013  . Pneumococcal Polysaccharide-23 11/16/1999  . Td 06/06/2004  . Tdap 10/18/2010  . Zoster 01/10/2006    Health Maintenance  Topic Date Due  . INFLUENZA VACCINE  08/09/2018  . TETANUS/TDAP  10/17/2020  . DEXA SCAN  Completed  . PNA vac Low Risk Adult  Completed     Discussed health benefits of physical activity, and encouraged her to engage in regular exercise appropriate for her age and condition.    ------------------------------------------------------------------------------------------------------------ 1. Annual physical exam Pt doing well--looks younger than her age. - Comprehensive metabolic panel - CBC with Differential/Platelet - Lipid panel - TSH  2. Hypercholesterolemia without hypertriglyceridemia  - Comprehensive metabolic panel - Lipid panel  3. Essential (primary) hypertension  - CBC with Differential/Platelet - TSH  4. Screening for breast cancer  - MM Digital Screening; Future  5. Screening for colon cancer  - Ambulatory  referral to Gastroenterology 6.AKs Per Dr Evorn Gong. 7.Lumbar Radiculopthy 8.MCI  Kaitlyn Kulak [place], Kaitlyn Zuniga  Kaitlyn Zuniga as a scribe for [name], Kaitlyn Zuniga.,have documented all relevant documentation on the behalf of [name], Kaitlyn Zuniga,as directed by  [name], Kaitlyn Zuniga while in the presence of [name], Kaitlyn Zuniga.

## 2018-06-24 DIAGNOSIS — L570 Actinic keratosis: Secondary | ICD-10-CM | POA: Insufficient documentation

## 2018-06-24 LAB — LIPID PANEL
Chol/HDL Ratio: 3.5 ratio (ref 0.0–4.4)
Cholesterol, Total: 175 mg/dL (ref 100–199)
HDL: 50 mg/dL (ref >39)
LDL Calculated: 103 mg/dL — ABNORMAL HIGH (ref 0–99)
Triglycerides: 110 mg/dL (ref 0–149)
VLDL Cholesterol Cal: 22 mg/dL (ref 5–40)

## 2018-06-24 LAB — COMPREHENSIVE METABOLIC PANEL
ALT: 9 IU/L (ref 0–32)
AST: 21 IU/L (ref 0–40)
Albumin/Globulin Ratio: 2 (ref 1.2–2.2)
Albumin: 4.5 g/dL (ref 3.6–4.6)
Alkaline Phosphatase: 45 IU/L (ref 39–117)
BUN/Creatinine Ratio: 15 (ref 12–28)
BUN: 13 mg/dL (ref 8–27)
Bilirubin Total: 0.8 mg/dL (ref 0.0–1.2)
CO2: 23 mmol/L (ref 20–29)
Calcium: 9.7 mg/dL (ref 8.7–10.3)
Chloride: 102 mmol/L (ref 96–106)
Creatinine, Ser: 0.87 mg/dL (ref 0.57–1.00)
GFR calc Af Amer: 71 mL/min/{1.73_m2} (ref >59)
GFR calc non Af Amer: 61 mL/min/{1.73_m2} (ref >59)
Globulin, Total: 2.3 g/dL (ref 1.5–4.5)
Glucose: 97 mg/dL (ref 65–99)
Potassium: 4 mmol/L (ref 3.5–5.2)
Sodium: 137 mmol/L (ref 134–144)
Total Protein: 6.8 g/dL (ref 6.0–8.5)

## 2018-06-24 LAB — CBC WITH DIFFERENTIAL/PLATELET
Basophils Absolute: 0 10*3/uL (ref 0.0–0.2)
Basos: 0 %
EOS (ABSOLUTE): 0 10*3/uL (ref 0.0–0.4)
Eos: 1 %
Hematocrit: 36.9 % (ref 34.0–46.6)
Hemoglobin: 12 g/dL (ref 11.1–15.9)
Immature Grans (Abs): 0 10*3/uL (ref 0.0–0.1)
Immature Granulocytes: 1 %
Lymphocytes Absolute: 1.3 10*3/uL (ref 0.7–3.1)
Lymphs: 37 %
MCH: 30.5 pg (ref 26.6–33.0)
MCHC: 32.5 g/dL (ref 31.5–35.7)
MCV: 94 fL (ref 79–97)
Monocytes Absolute: 0.6 10*3/uL (ref 0.1–0.9)
Monocytes: 16 %
Neutrophils Absolute: 1.7 10*3/uL (ref 1.4–7.0)
Neutrophils: 45 %
Platelets: 184 10*3/uL (ref 150–450)
RBC: 3.94 x10E6/uL (ref 3.77–5.28)
RDW: 12.9 % (ref 11.7–15.4)
WBC: 3.6 10*3/uL (ref 3.4–10.8)

## 2018-06-24 LAB — TSH: TSH: 1.71 u[IU]/mL (ref 0.450–4.500)

## 2018-06-26 DIAGNOSIS — M5416 Radiculopathy, lumbar region: Secondary | ICD-10-CM | POA: Diagnosis not present

## 2018-06-26 DIAGNOSIS — M48062 Spinal stenosis, lumbar region with neurogenic claudication: Secondary | ICD-10-CM | POA: Diagnosis not present

## 2018-06-26 DIAGNOSIS — M5126 Other intervertebral disc displacement, lumbar region: Secondary | ICD-10-CM | POA: Diagnosis not present

## 2018-07-08 DIAGNOSIS — M5136 Other intervertebral disc degeneration, lumbar region: Secondary | ICD-10-CM | POA: Insufficient documentation

## 2018-07-08 DIAGNOSIS — M51369 Other intervertebral disc degeneration, lumbar region without mention of lumbar back pain or lower extremity pain: Secondary | ICD-10-CM | POA: Insufficient documentation

## 2018-07-08 DIAGNOSIS — M170 Bilateral primary osteoarthritis of knee: Secondary | ICD-10-CM | POA: Diagnosis not present

## 2018-07-25 DIAGNOSIS — G8929 Other chronic pain: Secondary | ICD-10-CM | POA: Diagnosis not present

## 2018-07-25 DIAGNOSIS — M5416 Radiculopathy, lumbar region: Secondary | ICD-10-CM | POA: Diagnosis not present

## 2018-07-25 DIAGNOSIS — M5136 Other intervertebral disc degeneration, lumbar region: Secondary | ICD-10-CM | POA: Diagnosis not present

## 2018-07-25 DIAGNOSIS — M5442 Lumbago with sciatica, left side: Secondary | ICD-10-CM | POA: Diagnosis not present

## 2018-08-12 ENCOUNTER — Other Ambulatory Visit: Payer: Self-pay | Admitting: Family Medicine

## 2018-08-12 DIAGNOSIS — Z1231 Encounter for screening mammogram for malignant neoplasm of breast: Secondary | ICD-10-CM

## 2018-08-20 DIAGNOSIS — H40003 Preglaucoma, unspecified, bilateral: Secondary | ICD-10-CM | POA: Diagnosis not present

## 2018-08-26 DIAGNOSIS — M48062 Spinal stenosis, lumbar region with neurogenic claudication: Secondary | ICD-10-CM | POA: Diagnosis not present

## 2018-08-26 DIAGNOSIS — M5136 Other intervertebral disc degeneration, lumbar region: Secondary | ICD-10-CM | POA: Diagnosis not present

## 2018-08-26 DIAGNOSIS — M5416 Radiculopathy, lumbar region: Secondary | ICD-10-CM | POA: Diagnosis not present

## 2018-08-31 ENCOUNTER — Other Ambulatory Visit: Payer: Self-pay | Admitting: Family Medicine

## 2018-08-31 DIAGNOSIS — I10 Essential (primary) hypertension: Secondary | ICD-10-CM

## 2018-09-03 ENCOUNTER — Other Ambulatory Visit: Payer: Self-pay | Admitting: Family Medicine

## 2018-09-03 DIAGNOSIS — I10 Essential (primary) hypertension: Secondary | ICD-10-CM

## 2018-09-04 DIAGNOSIS — H40003 Preglaucoma, unspecified, bilateral: Secondary | ICD-10-CM | POA: Diagnosis not present

## 2018-09-26 DIAGNOSIS — Z23 Encounter for immunization: Secondary | ICD-10-CM | POA: Diagnosis not present

## 2018-10-17 DIAGNOSIS — M5416 Radiculopathy, lumbar region: Secondary | ICD-10-CM | POA: Diagnosis not present

## 2018-10-17 DIAGNOSIS — M5126 Other intervertebral disc displacement, lumbar region: Secondary | ICD-10-CM | POA: Diagnosis not present

## 2018-10-17 DIAGNOSIS — M48062 Spinal stenosis, lumbar region with neurogenic claudication: Secondary | ICD-10-CM | POA: Diagnosis not present

## 2018-10-17 DIAGNOSIS — M5136 Other intervertebral disc degeneration, lumbar region: Secondary | ICD-10-CM | POA: Diagnosis not present

## 2018-10-28 ENCOUNTER — Other Ambulatory Visit: Payer: Self-pay | Admitting: Neurosurgery

## 2018-10-28 DIAGNOSIS — M5116 Intervertebral disc disorders with radiculopathy, lumbar region: Secondary | ICD-10-CM | POA: Diagnosis not present

## 2018-10-28 DIAGNOSIS — M48061 Spinal stenosis, lumbar region without neurogenic claudication: Secondary | ICD-10-CM | POA: Diagnosis not present

## 2018-10-28 DIAGNOSIS — M47816 Spondylosis without myelopathy or radiculopathy, lumbar region: Secondary | ICD-10-CM | POA: Diagnosis not present

## 2018-10-28 DIAGNOSIS — M5416 Radiculopathy, lumbar region: Secondary | ICD-10-CM | POA: Diagnosis not present

## 2018-11-03 ENCOUNTER — Ambulatory Visit
Admission: RE | Admit: 2018-11-03 | Discharge: 2018-11-03 | Disposition: A | Discharge status: Home / Self Care | Payer: Medicare Other | Source: Ambulatory Visit | Attending: Family Medicine | Admitting: Family Medicine

## 2018-11-03 DIAGNOSIS — Z1231 Encounter for screening mammogram for malignant neoplasm of breast: Secondary | ICD-10-CM | POA: Diagnosis not present

## 2018-11-05 ENCOUNTER — Other Ambulatory Visit: Admission: RE | Admit: 2018-11-05 | Payer: Medicare Other | Source: Ambulatory Visit

## 2018-11-06 ENCOUNTER — Other Ambulatory Visit: Payer: Self-pay

## 2018-11-06 ENCOUNTER — Encounter
Admission: RE | Admit: 2018-11-06 | Discharge: 2018-11-06 | Disposition: A | Discharge status: Home / Self Care | Payer: Medicare Other | Source: Ambulatory Visit | Attending: Neurosurgery | Admitting: Neurosurgery

## 2018-11-06 ENCOUNTER — Other Ambulatory Visit: Admission: RE | Admit: 2018-11-06 | Payer: Medicare Other | Source: Ambulatory Visit

## 2018-11-06 DIAGNOSIS — Z01818 Encounter for other preprocedural examination: Secondary | ICD-10-CM | POA: Insufficient documentation

## 2018-11-06 DIAGNOSIS — I10 Essential (primary) hypertension: Secondary | ICD-10-CM | POA: Insufficient documentation

## 2018-11-06 DIAGNOSIS — Z20828 Contact with and (suspected) exposure to other viral communicable diseases: Secondary | ICD-10-CM | POA: Insufficient documentation

## 2018-11-06 HISTORY — DX: Unspecified osteoarthritis, unspecified site: M19.90

## 2018-11-06 LAB — URINALYSIS, ROUTINE W REFLEX MICROSCOPIC
Bilirubin Urine: NEGATIVE
Glucose, UA: NEGATIVE mg/dL
Hgb urine dipstick: NEGATIVE
Ketones, ur: NEGATIVE mg/dL
Leukocytes,Ua: NEGATIVE
Nitrite: NEGATIVE
Protein, ur: NEGATIVE mg/dL
Specific Gravity, Urine: 1.011 (ref 1.005–1.030)
pH: 6 (ref 5.0–8.0)

## 2018-11-06 LAB — BASIC METABOLIC PANEL
Anion gap: 9 (ref 5–15)
BUN: 15 mg/dL (ref 8–23)
CO2: 27 mmol/L (ref 22–32)
Calcium: 9.4 mg/dL (ref 8.9–10.3)
Chloride: 101 mmol/L (ref 98–111)
Creatinine, Ser: 0.86 mg/dL (ref 0.44–1.00)
GFR calc Af Amer: >60 mL/min (ref >60)
GFR calc non Af Amer: >60 mL/min (ref >60)
Glucose, Bld: 78 mg/dL (ref 70–99)
Potassium: 4 mmol/L (ref 3.5–5.1)
Sodium: 137 mmol/L (ref 135–145)

## 2018-11-06 LAB — CBC
HCT: 37.7 % (ref 36.0–46.0)
Hemoglobin: 12.1 g/dL (ref 12.0–15.0)
MCH: 30 pg (ref 26.0–34.0)
MCHC: 32.1 g/dL (ref 30.0–36.0)
MCV: 93.5 fL (ref 80.0–100.0)
Platelets: 189 10*3/uL (ref 150–400)
RBC: 4.03 MIL/uL (ref 3.87–5.11)
RDW: 12.9 % (ref 11.5–15.5)
WBC: 4.3 10*3/uL (ref 4.0–10.5)
nRBC: 0 % (ref 0.0–0.2)

## 2018-11-06 LAB — SARS CORONAVIRUS 2 (TAT 6-24 HRS): SARS Coronavirus 2: NEGATIVE

## 2018-11-06 LAB — SURGICAL PCR SCREEN
MRSA, PCR: NEGATIVE
Staphylococcus aureus: NEGATIVE

## 2018-11-06 LAB — TYPE AND SCREEN
ABO/RH(D): O POS
Antibody Screen: NEGATIVE

## 2018-11-06 LAB — APTT: aPTT: 31 seconds (ref 24–36)

## 2018-11-06 LAB — PROTIME-INR
INR: 1.1 (ref 0.8–1.2)
Prothrombin Time: 13.6 seconds (ref 11.4–15.2)

## 2018-11-06 NOTE — Patient Instructions (Addendum)
Your procedure is scheduled on: Mon 11/2 Report to Day Surgery. To find out your arrival time please call [phone] between 1PM - 3PM on Friday 11/30.  Remember: Instructions that are not followed completely may result in serious medical risk,  up to and including death, or upon the discretion of your surgeon and anesthesiologist your  surgery may need to be rescheduled.     _X__ 1. Do not eat food after midnight the night before your procedure.                 No gum chewing or hard candies. You may drink clear liquids up to 2 hours                 before you are scheduled to arrive for your surgery- DO not drink clear                 liquids within 2 hours of the start of your surgery.                 Clear Liquids include:  water, apple juice without pulp, clear carbohydrate                 drink such as Clearfast of Gatorade, Black Coffee or Tea (Do not add                 anything to coffee or tea).  __X__2.  On the morning of surgery brush your teeth with toothpaste and water, you                may rinse your mouth with mouthwash if you wish.  Do not swallow any toothpaste of mouthwash.     __ 3.  No Alcohol for 24 hours before or after surgery.   _ 4.  Do Not Smoke or use e-cigarettes For 24 Hours Prior to Your Surgery.                 Do not use any chewable tobacco products for at least 6 hours prior to                 surgery.  ____  5.  Bring all medications with you on the day of surgery if instructed.   _x___  6.  Notify your doctor if there is any change in your medical condition      (cold, fever, infections).     Do not wear jewelry, make-up, hairpins, clips or nail polish. Do not wear lotions, powders, or perfumes. You may wear deodorant. Do not shave 48 hours prior to surgery. Men may shave face and neck. Do not bring valuables to the hospital.    [facility] is not responsible for any belongings or valuables.  Contacts, dentures or  bridgework may not be worn into surgery. Leave your suitcase in the car. After surgery it may be brought to your room. For patients admitted to the hospital, discharge time is determined by your treatment team.   Patients discharged the day of surgery will not be allowed to drive home.   Please read over the following fact sheets that you were given:    __x__ Take these medicines the morning of surgery with A SIP OF WATER:    1.none  2.   3.   4.  5.  6.  ____ Fleet Enema (as directed)   __x__ Use CHG Soap as directed  ____ Use inhalers on the day of surgery  ____ Stop metformin 2 days prior to surgery    ____ Take 1/2 of usual insulin dose the night before surgery. No insulin the morning          of surgery.   _x___ Stop aspirin aspirin EC 81 MG tablet today  __x__ Stop Anti-inflammatories ibuprofen, aleve or aspirin    May take tylenol    _x___ Stop supplements until after surgery.  Omega-3 Fatty Acids (FISH OIL) 1000 MG CAPS  ____ Bring C-Pap to the hospital.

## 2018-11-10 ENCOUNTER — Ambulatory Visit: Payer: Medicare Other | Admitting: Certified Registered"

## 2018-11-10 ENCOUNTER — Ambulatory Visit: Payer: Medicare Other

## 2018-11-10 ENCOUNTER — Encounter
Admission: RE | Disposition: A | Discharge status: Home / Self Care | Payer: Self-pay | Source: Home / Self Care | Attending: Neurosurgery

## 2018-11-10 ENCOUNTER — Other Ambulatory Visit: Payer: Self-pay

## 2018-11-10 ENCOUNTER — Ambulatory Visit
Admission: RE | Admit: 2018-11-10 | Discharge: 2018-11-10 | Disposition: A | Discharge status: Home / Self Care | Payer: Medicare Other | Attending: Neurosurgery | Admitting: Neurosurgery

## 2018-11-10 DIAGNOSIS — Z7982 Long term (current) use of aspirin: Secondary | ICD-10-CM | POA: Diagnosis not present

## 2018-11-10 DIAGNOSIS — M5416 Radiculopathy, lumbar region: Secondary | ICD-10-CM | POA: Diagnosis not present

## 2018-11-10 DIAGNOSIS — I10 Essential (primary) hypertension: Secondary | ICD-10-CM | POA: Insufficient documentation

## 2018-11-10 DIAGNOSIS — Z87891 Personal history of nicotine dependence: Secondary | ICD-10-CM | POA: Diagnosis not present

## 2018-11-10 DIAGNOSIS — Z79899 Other long term (current) drug therapy: Secondary | ICD-10-CM | POA: Insufficient documentation

## 2018-11-10 DIAGNOSIS — Z419 Encounter for procedure for purposes other than remedying health state, unspecified: Secondary | ICD-10-CM

## 2018-11-10 DIAGNOSIS — M48061 Spinal stenosis, lumbar region without neurogenic claudication: Secondary | ICD-10-CM | POA: Diagnosis not present

## 2018-11-10 DIAGNOSIS — M47896 Other spondylosis, lumbar region: Secondary | ICD-10-CM | POA: Insufficient documentation

## 2018-11-10 DIAGNOSIS — M419 Scoliosis, unspecified: Secondary | ICD-10-CM | POA: Insufficient documentation

## 2018-11-10 DIAGNOSIS — Z8249 Family history of ischemic heart disease and other diseases of the circulatory system: Secondary | ICD-10-CM | POA: Diagnosis not present

## 2018-11-10 DIAGNOSIS — Z981 Arthrodesis status: Secondary | ICD-10-CM | POA: Diagnosis not present

## 2018-11-10 HISTORY — PX: HEMI-MICRODISCECTOMY LUMBAR LAMINECTOMY LEVEL 2: SHX5847

## 2018-11-10 LAB — ABO/RH: ABO/RH(D): O POS

## 2018-11-10 SURGERY — HEMI-MICRODISCECTOMY LUMBAR LAMINECTOMY LEVEL 2
Anesthesia: General | Site: Back

## 2018-11-10 MED ORDER — GLYCOPYRROLATE 0.2 MG/ML IJ SOLN
INTRAMUSCULAR | Status: AC
  Filled 2018-11-10: qty 1

## 2018-11-10 MED ORDER — EPHEDRINE SULFATE 50 MG/ML IJ SOLN
INTRAMUSCULAR | Status: DC | PRN
  Administered 2018-11-10 (×2): 10 mg via INTRAVENOUS

## 2018-11-10 MED ORDER — THROMBIN 5000 UNITS EX SOLR
CUTANEOUS | Status: AC
  Filled 2018-11-10: qty 5000

## 2018-11-10 MED ORDER — ONDANSETRON HCL 4 MG/2ML IJ SOLN
INTRAMUSCULAR | Status: DC | PRN
  Administered 2018-11-10: 4 mg via INTRAVENOUS

## 2018-11-10 MED ORDER — LIDOCAINE HCL (CARDIAC) PF 100 MG/5ML IV SOSY
PREFILLED_SYRINGE | INTRAVENOUS | Status: DC | PRN
  Administered 2018-11-10: 80 mg via INTRAVENOUS

## 2018-11-10 MED ORDER — FAMOTIDINE 20 MG PO TABS
ORAL_TABLET | ORAL | Status: AC
  Administered 2018-11-10: 09:00:00 20 mg via ORAL
  Filled 2018-11-10: qty 1

## 2018-11-10 MED ORDER — LIDOCAINE HCL (PF) 2 % IJ SOLN
INTRAMUSCULAR | Status: AC
  Filled 2018-11-10: qty 10

## 2018-11-10 MED ORDER — DEXAMETHASONE SODIUM PHOSPHATE 10 MG/ML IJ SOLN
INTRAMUSCULAR | Status: DC | PRN
  Administered 2018-11-10: 5 mg via INTRAVENOUS

## 2018-11-10 MED ORDER — ROCURONIUM BROMIDE 50 MG/5ML IV SOLN
INTRAVENOUS | Status: AC
  Filled 2018-11-10: qty 1

## 2018-11-10 MED ORDER — THROMBIN 5000 UNITS EX SOLR
CUTANEOUS | Status: DC | PRN
  Administered 2018-11-10: 5000 [IU] via TOPICAL

## 2018-11-10 MED ORDER — FENTANYL CITRATE (PF) 100 MCG/2ML IJ SOLN
INTRAMUSCULAR | Status: DC | PRN
  Administered 2018-11-10 (×2): 50 ug via INTRAVENOUS

## 2018-11-10 MED ORDER — FENTANYL CITRATE (PF) 100 MCG/2ML IJ SOLN: 25.0000 ug | INTRAMUSCULAR | Status: DC | PRN

## 2018-11-10 MED ORDER — TRAMADOL HCL 50 MG PO TABS
50.0000 mg | ORAL_TABLET | Freq: Four times a day (QID) | ORAL | Status: DC | PRN
  Administered 2018-11-10: 50 mg via ORAL

## 2018-11-10 MED ORDER — ONDANSETRON HCL 4 MG/2ML IJ SOLN
INTRAMUSCULAR | Status: AC
  Filled 2018-11-10: qty 2

## 2018-11-10 MED ORDER — TRAMADOL HCL 50 MG PO TABS
ORAL_TABLET | ORAL | Status: AC
  Filled 2018-11-10: qty 1

## 2018-11-10 MED ORDER — PROPOFOL 10 MG/ML IV BOLUS
INTRAVENOUS | Status: AC
  Filled 2018-11-10: qty 20

## 2018-11-10 MED ORDER — CEFAZOLIN SODIUM-DEXTROSE 2-4 GM/100ML-% IV SOLN
2.0000 g | Freq: Once | INTRAVENOUS | Status: AC
  Administered 2018-11-10: 11:00:00 2 g via INTRAVENOUS

## 2018-11-10 MED ORDER — FENTANYL CITRATE (PF) 250 MCG/5ML IJ SOLN
INTRAMUSCULAR | Status: AC
  Filled 2018-11-10: qty 5

## 2018-11-10 MED ORDER — BUPIVACAINE HCL (PF) 0.5 % IJ SOLN
INTRAMUSCULAR | Status: AC
  Filled 2018-11-10: qty 30

## 2018-11-10 MED ORDER — TRAMADOL HCL 50 MG PO TABS: 50.0000 mg | ORAL_TABLET | Freq: Four times a day (QID) | ORAL | 0 refills | Status: AC | PRN

## 2018-11-10 MED ORDER — METHYLPREDNISOLONE ACETATE 40 MG/ML IJ SUSP
INTRAMUSCULAR | Status: AC
  Filled 2018-11-10: qty 1

## 2018-11-10 MED ORDER — ONDANSETRON HCL 4 MG/2ML IJ SOLN: 4.0000 mg | Freq: Once | INTRAMUSCULAR | Status: DC | PRN

## 2018-11-10 MED ORDER — REMIFENTANIL HCL 1 MG IV SOLR
INTRAVENOUS | Status: AC
  Filled 2018-11-10: qty 1000

## 2018-11-10 MED ORDER — EPINEPHRINE PF 1 MG/ML IJ SOLN
INTRAMUSCULAR | Status: AC
  Filled 2018-11-10: qty 1

## 2018-11-10 MED ORDER — DEXAMETHASONE SODIUM PHOSPHATE 10 MG/ML IJ SOLN
INTRAMUSCULAR | Status: AC
  Filled 2018-11-10: qty 1

## 2018-11-10 MED ORDER — GLYCOPYRROLATE 0.2 MG/ML IJ SOLN
INTRAMUSCULAR | Status: DC | PRN
  Administered 2018-11-10: 0.2 mg via INTRAVENOUS

## 2018-11-10 MED ORDER — EPHEDRINE SULFATE 50 MG/ML IJ SOLN
INTRAMUSCULAR | Status: AC
  Filled 2018-11-10: qty 1

## 2018-11-10 MED ORDER — METHYLPREDNISOLONE ACETATE 40 MG/ML IJ SUSP
INTRAMUSCULAR | Status: DC | PRN
  Administered 2018-11-10: 40 mg

## 2018-11-10 MED ORDER — LACTATED RINGERS IV SOLN
INTRAVENOUS | Status: DC
  Administered 2018-11-10: 09:00:00 via INTRAVENOUS

## 2018-11-10 MED ORDER — REMIFENTANIL HCL 1 MG IV SOLR
INTRAVENOUS | Status: DC | PRN
  Administered 2018-11-10: .09 ug/kg/min via INTRAVENOUS

## 2018-11-10 MED ORDER — SUCCINYLCHOLINE CHLORIDE 20 MG/ML IJ SOLN
INTRAMUSCULAR | Status: DC | PRN
  Administered 2018-11-10: 100 mg via INTRAVENOUS

## 2018-11-10 MED ORDER — CEFAZOLIN SODIUM-DEXTROSE 2-4 GM/100ML-% IV SOLN
INTRAVENOUS | Status: AC
  Filled 2018-11-10: qty 100

## 2018-11-10 MED ORDER — PROPOFOL 10 MG/ML IV BOLUS
INTRAVENOUS | Status: DC | PRN
  Administered 2018-11-10: 100 mg via INTRAVENOUS

## 2018-11-10 MED ORDER — FAMOTIDINE 20 MG PO TABS
20.0000 mg | ORAL_TABLET | Freq: Once | ORAL | Status: AC
  Administered 2018-11-10: 09:00:00 20 mg via ORAL

## 2018-11-10 MED ORDER — BUPIVACAINE-EPINEPHRINE (PF) 0.5% -1:200000 IJ SOLN
INTRAMUSCULAR | Status: DC | PRN
  Administered 2018-11-10: 10 mL

## 2018-11-10 MED ORDER — SODIUM CHLORIDE 0.9 % IV SOLN
INTRAVENOUS | Status: DC | PRN
  Administered 2018-11-10: 35 ug/min via INTRAVENOUS

## 2018-11-10 SURGICAL SUPPLY — 62 items
BLADE BOVIE TIP EXT 4 (BLADE) ×2 IMPLANT
BUR NEURO DRILL SOFT 3.0X3.8M (BURR) ×2 IMPLANT
CANISTER SUCT 1200ML W/VALVE (MISCELLANEOUS) ×2 IMPLANT
CHLORAPREP W/TINT 26 (MISCELLANEOUS) ×4 IMPLANT
CNTNR SPEC 2.5X3XGRAD LEK (MISCELLANEOUS) ×1
CONT SPEC 4OZ STER OR WHT (MISCELLANEOUS) ×1
CONTAINER SPEC 2.5X3XGRAD LEK (MISCELLANEOUS) ×1 IMPLANT
COUNTER NEEDLE 20/40 LG (NEEDLE) ×2 IMPLANT
COVER LIGHT HANDLE STERIS (MISCELLANEOUS) ×4 IMPLANT
COVER WAND RF STERILE (DRAPES) ×2 IMPLANT
CUP MEDICINE 2OZ PLAST GRAD ST (MISCELLANEOUS) ×2 IMPLANT
DERMABOND ADVANCED (GAUZE/BANDAGES/DRESSINGS) ×1
DERMABOND ADVANCED .7 DNX12 (GAUZE/BANDAGES/DRESSINGS) ×1 IMPLANT
DRAPE C-ARM 42X70 (DRAPES) ×4 IMPLANT
DRAPE LAPAROTOMY 100X77 ABD (DRAPES) ×2 IMPLANT
DRAPE MICROSCOPE SPINE 48X150 (DRAPES) ×2 IMPLANT
DRAPE POUCH INSTRU U-SHP 10X18 (DRAPES) ×2 IMPLANT
DRAPE SURG 17X11 SM STRL (DRAPES) ×4 IMPLANT
DURASEAL APPLICATOR TIP (TIP) IMPLANT
DURASEAL SPINE SEALANT 3ML (MISCELLANEOUS) IMPLANT
ELECT CAUTERY BLADE TIP 2.5 (TIP) ×2
ELECT EZSTD 165MM 6.5IN (MISCELLANEOUS) ×2
ELECT REM PT RETURN 9FT ADLT (ELECTROSURGICAL) ×2
ELECTRODE CAUTERY BLDE TIP 2.5 (TIP) ×1 IMPLANT
ELECTRODE EZSTD 165MM 6.5IN (MISCELLANEOUS) ×1 IMPLANT
ELECTRODE REM PT RTRN 9FT ADLT (ELECTROSURGICAL) ×1 IMPLANT
GAUZE SPONGE 4X4 12PLY STRL (GAUZE/BANDAGES/DRESSINGS) ×2 IMPLANT
GLOVE BIO SURGEON STRL SZ 6.5 (GLOVE) ×4 IMPLANT
GLOVE BIOGEL PI IND STRL 8 (GLOVE) ×1 IMPLANT
GLOVE BIOGEL PI INDICATOR 8 (GLOVE) ×1
GLOVE INDICATOR 7.0 STRL GRN (GLOVE) ×2 IMPLANT
GLOVE INDICATOR 8.0 STRL GRN (GLOVE) ×2 IMPLANT
GLOVE SURG SYN 7.0 (GLOVE) ×4 IMPLANT
GLOVE SURG SYN 8.0 (GLOVE) ×4 IMPLANT
GOWN STRL REUS W/ TWL LRG LVL3 (GOWN DISPOSABLE) ×1 IMPLANT
GOWN STRL REUS W/ TWL XL LVL3 (GOWN DISPOSABLE) ×1 IMPLANT
GOWN STRL REUS W/TWL LRG LVL3 (GOWN DISPOSABLE) ×1
GOWN STRL REUS W/TWL MED LVL3 (GOWN DISPOSABLE) ×2 IMPLANT
GOWN STRL REUS W/TWL XL LVL3 (GOWN DISPOSABLE) ×1
GRADUATE 1200CC STRL 31836 (MISCELLANEOUS) ×2 IMPLANT
KIT TURNOVER KIT A (KITS) ×2 IMPLANT
KIT WILSON FRAME (KITS) ×2 IMPLANT
KNIFE BAYONET SHORT DISCETOMY (MISCELLANEOUS) ×2 IMPLANT
MARKER SKIN DUAL TIP RULER LAB (MISCELLANEOUS) ×4 IMPLANT
NDL SAFETY ECLIPSE 18X1.5 (NEEDLE) ×2 IMPLANT
NEEDLE HYPO 18GX1.5 SHARP (NEEDLE) ×2
NEEDLE HYPO 22GX1.5 SAFETY (NEEDLE) ×2 IMPLANT
NS IRRIG 1000ML POUR BTL (IV SOLUTION) ×2 IMPLANT
PACK LAMINECTOMY NEURO (CUSTOM PROCEDURE TRAY) ×2 IMPLANT
PAD ARMBOARD 7.5X6 YLW CONV (MISCELLANEOUS) ×2 IMPLANT
SPOGE SURGIFLO 8M (HEMOSTASIS) ×1
SPONGE SURGIFLO 8M (HEMOSTASIS) ×1 IMPLANT
SUT NURALON 4 0 TR CR/8 (SUTURE) IMPLANT
SUT POLYSORB 2-0 5X18 GS-10 (SUTURE) ×4 IMPLANT
SUT VIC AB 0 CT1 18XCR BRD 8 (SUTURE) ×1 IMPLANT
SUT VIC AB 0 CT1 8-18 (SUTURE) ×1
SYR 20ML LL LF (SYRINGE) ×2 IMPLANT
SYR 3ML LL SCALE MARK (SYRINGE) ×4 IMPLANT
TOWEL OR 17X26 4PK STRL BLUE (TOWEL DISPOSABLE) ×6 IMPLANT
TRAY FOLEY MTR SLVR 16FR STAT (SET/KITS/TRAYS/PACK) IMPLANT
TUBE METRX 18MMX5CM (INSTRUMENTS) ×2 IMPLANT
TUBING CONNECTING 10 (TUBING) ×2 IMPLANT

## 2018-11-10 NOTE — Anesthesia Post-op Follow-up Note (Signed)
Anesthesia QCDR form completed.        

## 2018-11-10 NOTE — Transfer of Care (Signed)
Immediate Anesthesia Transfer of Care Note  Patient: Kaitlyn Zuniga  Procedure(s) Performed: LEFT L3-4, L4-5 HEMILAMINECTOMY & DISCECTOMY (N/A Back)  Patient Location: PACU  Anesthesia Type:General  Level of Consciousness: awake and drowsy  Airway & Oxygen Therapy: Patient Spontanous Breathing and Patient connected to face mask oxygen  Post-op Assessment: Report given to RN and Post -op Vital signs reviewed and stable  Post vital signs: Reviewed and stable  Last Vitals:  Vitals Value Taken Time  BP 134/72 11/10/18 1357  Temp 36.2 C 11/10/18 1357  Pulse 91 11/10/18 1359  Resp 15 11/10/18 1359  SpO2 100 % 11/10/18 1359  Vitals shown include unvalidated device data.  Last Pain:  Vitals:   11/10/18 0828  TempSrc: Tympanic  PainSc: 0-No pain         Complications: No apparent anesthesia complications

## 2018-11-10 NOTE — Op Note (Signed)
Operative Note  SURGERY DATE:11/10/2018  PRE-OP DIAGNOSIS: Lumbar Stenosis withLumbar Radiculopathy(m48.062)  POST-OP DIAGNOSIS:Post-Op Diagnosis Codes: Lumbar Stenosis withLumbar Radiculopathy(m48.062)  Procedure(s) with comments: LeftL4/5 and L3/4HemilaminectomywithMedial Facetectomy and Discectomy  SURGEON:  * Malen Gauze, MD Marin Olp, PA Assistant  ANESTHESIA:General  OPERATIVE FINDINGS: Lateral recess stenosisat leftL4/5, central and lateral at L3/4  OPERATIVE REPORT:   Indication: Ms. Board presented to clinic on10/20with ongoingleftleg pain preventingnormal activity.Shehad failed conservative managementof injections, medications, and therapy and the symptoms were affecting herlifestyle. MRI revealedleft L4/5 and L3/4 centralstenosis compressingthetraversingnerve roots.She had severe degenrative changes and scoliosis. Therisks of surgery were explained to include hematoma, infection, damage to nerve roots, CSF leak, weakness, numbness, pain, need for future surgery including fusion, heart attack, and stroke.Sheelected to proceed with surgery for symptom relief. We discussed that no large surgery could be done for all the degenerative changes but the decompression should help with left leg pain.   Procedure The patient was brought to the OR after informed consent was obtained.She was given general anesthesia and intubated by the anesthesia service. Vascular access lines were placed.The patient was then placed prone on a Wilson frameensuring all pressure points were padded.Antibiotics were administered.A time-out was performed per protocol.   The patient was sterilely prepped and draped. Fluoroscopy confirmedL4/5interspaceandanincision was planned1.5cm off midlineon theleft.The incision was instilled withlocal anesthetic with epinephrine. The skin was opened sharply and the dissection taken to the  fascia. This was incised and initial dilator placedthe spinous processes and lamina of L4on the leftout to the medial edge of the facet. Serial dilatorswere inserted via fluoroscopy and the final67mm tube was placed at depth of5cm.  The microscope was brought into the field. The overlying muscle was removed from lamina and medial facet.Next, a matchstickdrill bit was used to remove theL4lamina centrallyand going laterally.A medial facetectomy was performed.We did remove lamina at the top of the sacrum.The underlying ligament was freed and removed with combination of rongeurs. The dura was seen to be full and intact.Once all ligament and soft tissue was removed, attention was turned to inspection of thelateral recess.The ligament was removed here with curettes and rongeurs until a ball tip probe passed freely. Working under the dura, a firm bulge was palpated and the PLL was entered. There was a soft disc that was expressed and removed. The epidural space was seen to be flat after this and a blunt instrument passed out laterally easily. Hemostasis was obtained and wound irrigated. Depomedrol was placed along thecal sac and nerve root.The working channel was removed.   The incision was extended rostrally and the fascia opened. Dilators were used in similar manner to dock at L3 lamina on the left. The overlying muscle was removed from lamina and medial facet.Next, a matchstickdrill bit was used to remove theL3lamina centrallyand going laterally on both sides.There was considerable ligament that was removed here from L3 down to L4. The dura was seen to be full and intact.Once all ligament and soft tissue was removed, attention was turned to inspection of thelateral recess.The ligament was removed here with curettes and rongeurs until a ball tip probe passed freely. Hemostasis was obtained and wound irrigated. Depomedrol was placed along thecal sac and nerve root.The working channel was  removed.   The microscope was removed.Themuscle andfasciawas then closed using 0and 2-0vicryl followed by thesubcutaneous and dermal layers with 2-0 vicryluntil the epidermis was well approximated. The skin was closed withDermabond..  The patient was returned to supine position and extubated by the anesthesia service. The patient  was then taken to the PACU for post-operative care whereshe was moving extremities symmetrically.   ESTIMATED BLOOD LOSS: 28cc  SPECIMENS None  IMPLANT None   I performed the case in its entiretywith assistance of PA, Corrie Mckusick, [place]

## 2018-11-10 NOTE — H&P (Signed)
Kaitlyn Zuniga is an 86 y.o. female.   Chief Complaint: Left leg pain HPI: Kaitlyn Zuniga is here for evaluation of ongoing symptoms of left leg pain. She states that the pain is more on the lateral thigh and calf and it will go into the top of the foot. She denies any right leg symptoms. She does note some tingling in this area but denies frank numbness. She does not notice any weakness but the pain does limit how much she can do with standing and walking. She is usually very active outside. She is attempted physical therapy and did see some relief with this but she has also tried 3 epidural steroid injections without more than 3 to 4 days of relief. She is not complaining of any significant back pain but she does have some tenderness off to the left in the lumbar paraspinal area. She had a MRI of the lumbar spine showing stenosis on left at L3/4 and L4/5. We discussed she has severe degenerative changes, listhesis, and scoliosis, however she is not a candidate for a large fusion and we can proceed with small decompression on left. She is here to proceed   Past Medical History:  Diagnosis Date  . Arthritis   . Hypertension     Past Surgical History:  Procedure Laterality Date  . BREAST BIOPSY Right 2006   benign  . BREAST SURGERY Right [id number]   Biopsy    Family History  Problem Relation Age of Onset  . Cancer Mother        lung  . Cancer Father        prostate  . Hypertension Father   . Breast cancer Sister 31   Social History:  reports that she has quit smoking. She quit after 10.00 years of use. She has never used smokeless tobacco. She reports that she does not drink alcohol or use drugs.  Allergies: No Known Allergies  Medications Prior to Admission  Medication Sig Dispense Refill  . acetaminophen (TYLENOL) 500 MG tablet Take 500 mg by mouth every 6 (six) hours as needed for mild pain.    Marland Kitchen aspirin EC 81 MG tablet Take 81 mg by mouth daily.     . Calcium Carb-Cholecalciferol  (CALCIUM PLUS VITAMIN D3) 600-500 MG-UNIT CAPS Take 1 tablet by mouth daily.    . carboxymethylcellulose (REFRESH PLUS) 0.5 % SOLN Place 1 drop into both eyes daily.    Marland Kitchen lisinopril (ZESTRIL) 20 MG tablet Take 1 tablet by mouth once daily 90 tablet 3  . Lysine 500 MG TABS Take 500 mg by mouth daily.     . MULTIPLE VITAMIN PO Take 1 tablet by mouth daily.     . Omega-3 Fatty Acids (FISH OIL) 1000 MG CAPS Take 2,000 mg by mouth daily.     . traMADol (ULTRAM) 50 MG tablet Take 50 mg by mouth 2 (two) times daily as needed.      No results found for this or any previous visit (from the past 48 hour(s)). No results found.  ROS General ROS: Negative Psychological ROS: Negative Ophthalmic ROS: Negative ENT ROS: Negative Hematological and Lymphatic ROS: Negative  Endocrine ROS: Negative Respiratory ROS: Negative Cardiovascular ROS: Negative Gastrointestinal ROS: Negative Genito-Urinary ROS: Negative Musculoskeletal ROS: Negative for back pain Neurological ROS: Positive for left leg pain Dermatological ROS: Negative  Blood pressure (!) 150/74, pulse 84, temperature (!) 96.7 F (35.9 C), temperature source Tympanic, resp. rate 16, SpO2 97 %. Physical Exam  General appearance: Alert,  cooperative, in no acute distress Head: Normocephalic, atraumatic Eyes: Normal, EOM intact Oropharynx: Wearing facemask CV: Regular rate Pulm: Clear to auscultation Neck: No tenderness to palpation of the midline, some tenderness in the left paraspinal area Ext: No edema in LE bilaterally, warm extremities  Neurologic exam:  Mental status: alertness: alert, affect: normal Speech: fluent and clear Motor:strength symmetric 5/5 in bilateral lower extremities in hip flexion, knee flexion, knee extension, dorsiflexion, plantarflexion Sensory: intact to light touch in bilateral lower extremities Reflexes: 2+ at right patella, 1+ at left patella Gait: normal    MRI lumbar spine: There is significant  degenerative disease noted throughout the lumbar spine. This is most pronounced from L2-L4. There is a scoliotic curvature noted at these levels. There is also a grade 1 listhesis at L3-4. There is a disc bulge eccentric to the right at L2-3 with a hypertrophied facet the results in severe lateral recess stenosis. At L3-4 there is more central and bilateral, lateral recess stenosis due to a broad-based disc bulge and hypertrophied ligamentum flavum. At L4-5 there is more left eccentric stenosis due to a disc bulge and hypertrophied joint.    Assessment/Plan Plan for left L3/4 and L4/5 hemilaminectomy and decompression  Kaitlyn Perla, MD 11/10/2018, 10:07 AM

## 2018-11-10 NOTE — Anesthesia Procedure Notes (Signed)
Procedure Name: Intubation Performed by: Fredderick Phenix, CRNA Pre-anesthesia Checklist: Patient identified, Emergency Drugs available, Suction available and Patient being monitored Patient Re-evaluated:Patient Re-evaluated prior to induction Oxygen Delivery Method: Circle system utilized Preoxygenation: Pre-oxygenation with 100% oxygen Induction Type: IV induction Ventilation: Mask ventilation without difficulty Laryngoscope Size: Mac and 3 Grade View: Grade II Tube type: Oral Tube size: 7.0 mm Number of attempts: 1 Airway Equipment and Method: Stylet and Oral airway Placement Confirmation: ETT inserted through vocal cords under direct vision,  positive ETCO2 and breath sounds checked- equal and bilateral Secured at: 21 cm Tube secured with: Tape Dental Injury: Teeth and Oropharynx as per pre-operative assessment

## 2018-11-10 NOTE — Discharge Summary (Signed)
Procedure: L3-4 and L4-5 lumbar decompression  Procedure date: 11/10/2018 Diagnosis: Lumbar radiculopathy   History: Kaitlyn Zuniga is s/p L3-4 and L4-5 lumbar decompression for lumbar radiculopathy.   POD0: Tolerated procedure well without complication. Evaluated in post op recovery still disoriented from anesthesia. No complaints at this time. Denies and new lower extremity pain/numbness/tingling.   Physical Exam: Vitals:   11/10/18 0828  BP: (!) 150/74  Pulse: 84  Resp: 16  Temp: (!) 96.7 F (35.9 C)  SpO2: 97%   Strength: Able to move all extremities independently Sensation: Unable to fully assess at this time.  Skin: dressing clean and dry.  Data:  Recent Labs  Lab 11/06/18 1034  NA 137  K 4.0  CL 101  CO2 27  BUN 15  CREATININE 0.86  GLUCOSE 78  CALCIUM 9.4   No results for input(s): AST, ALT, ALKPHOS in the last 168 hours.  Invalid input(s): TBILI   Recent Labs  Lab 11/06/18 1034  WBC 4.3  HGB 12.1  HCT 37.7  PLT 189   Recent Labs  Lab 11/06/18 1034  APTT 31  INR 1.1         Other tests/results: No imaging reviewed.   Assessment/Plan:  Kaitlyn Zuniga is s/p L3-4 and L4-5 lumbar decompression for lumbar radiculopathy. Will continue post op pain control with tramadol and tylenol. She is scheduled to follow up in clinic in approximately 2 weeks to monitor progress.   Marin Olp PA-C Department of Neurosurgery

## 2018-11-10 NOTE — Anesthesia Preprocedure Evaluation (Signed)
Anesthesia Evaluation  Patient identified by MRN, date of birth, ID band Patient awake    Reviewed: Allergy & Precautions, H&P , NPO status , Patient's Chart, lab work & pertinent test results, reviewed documented beta blocker date and time   Airway Mallampati: II  TM Distance: >3 FB Neck ROM: full    Dental  (+) Teeth Intact   Pulmonary neg pulmonary ROS, former smoker,    Pulmonary exam normal        Cardiovascular Exercise Tolerance: Good hypertension, On Medications negative cardio ROS Normal cardiovascular exam Rhythm:regular Rate:Normal     Neuro/Psych  Neuromuscular disease negative psych ROS   GI/Hepatic negative GI ROS, Neg liver ROS,   Endo/Other  negative endocrine ROS  Renal/GU negative Renal ROS  negative genitourinary   Musculoskeletal   Abdominal   Peds  Hematology negative hematology ROS (+)   Anesthesia Other Findings Past Medical History: No date: Arthritis No date: Hypertension Past Surgical History: 2006: BREAST BIOPSY; Right     Comment:  benign [id number]: BREAST SURGERY; Right     Comment:  Biopsy   Reproductive/Obstetrics negative OB ROS                             Anesthesia Physical Anesthesia Plan  ASA: II  Anesthesia Plan: General ETT   Post-op Pain Management:    Induction:   PONV Risk Score and Plan:   Airway Management Planned:   Additional Equipment:   Intra-op Plan:   Post-operative Plan:   Informed Consent: I have reviewed the patients History and Physical, chart, labs and discussed the procedure including the risks, benefits and alternatives for the proposed anesthesia with the patient or authorized representative who has indicated his/her understanding and acceptance.     Dental Advisory Given  Plan Discussed with: CRNA  Anesthesia Plan Comments:         Anesthesia Quick Evaluation

## 2018-11-10 NOTE — Interval H&P Note (Signed)
History and Physical Interval Note:  11/10/2018 10:10 AM  Kaitlyn Zuniga  has presented today for surgery, with the diagnosis of lumbar radiculopathy m54.16.  The various methods of treatment have been discussed with the patient and family. After consideration of risks, benefits and other options for treatment, the patient has consented to  Procedure(s): LEFT L3-4, L4-5 HEMILAMINECTOMY & DISCECTOMY (N/A) as a surgical intervention.  The patient's history has been reviewed, patient examined, no change in status, stable for surgery.  I have reviewed the patient's chart and labs.  Questions were answered to the patient's satisfaction.     Deetta Perla

## 2018-11-10 NOTE — Discharge Instructions (Addendum)
Your surgeon has performed an operation on your lumbar spine (low back) to relieve pressure on one or more nerves. Many times, patients feel better immediately after surgery and can overdo it. Even if you feel well, it is important that you follow these activity guidelines. If you do not let your back heal properly from the surgery, you can increase the chance of a disc herniation and/or return of your symptoms. The following are instructions to help in your recovery once you have been discharged from the hospital.  * Do not take anti-inflammatory medications for 3 days after surgery (naproxen [Aleve], ibuprofen [Advil, Motrin], celecoxib [Celebrex], etc.) * You may restart aspirin and omega 3 supplement after 7 days  Activity    No bending, lifting, or twisting (BLT). Avoid lifting objects heavier than 10 pounds (gallon milk jug).  Where possible, avoid household activities that involve lifting, bending, pushing, or pulling such as laundry, vacuuming, grocery shopping, and childcare. Try to arrange for help from friends and family for these activities while your back heals.  Increase physical activity slowly as tolerated.  Taking short walks is encouraged, but avoid strenuous exercise. Do not jog, run, bicycle, lift weights, or participate in any other exercises unless specifically allowed by your doctor. Avoid prolonged sitting, including car rides.  Talk to your doctor before resuming sexual activity.  You should not drive until cleared by your doctor.  Until released by your doctor, you should not return to work or school.  You should rest at home and let your body heal.   You may shower two days after your surgery.  After showering, lightly dab your incision dry. Do not take a tub bath or go swimming for 3 weeks, or until approved by your doctor at your follow-up appointment.  If you smoke, we strongly recommend that you quit.  Smoking has been proven to interfere with normal healing in  your back and will dramatically reduce the success rate of your surgery. Please contact [facility] (800-QUIT-NOW) and use the resources at www.[facility].com for assistance in stopping smoking.  Surgical Incision   If you have a dressing on your incision, you may remove it three days after your surgery. Keep your incision area clean and dry.  If you have staples or stitches on your incision, you should have a follow up scheduled for removal. If you do not have staples or stitches, you will have steri-strips (small pieces of surgical tape) or Dermabond glue. The steri-strips/glue should begin to peel away within about a week (it is fine if the steri-strips fall off before then). If the strips are still in place one week after your surgery, you may gently remove them.  Diet            You may return to your usual diet. Be sure to stay hydrated.  When to Contact us  Although your surgery and recovery will likely be uneventful, you may have some residual numbness, aches, and pains in your back and/or legs. This is normal and should improve in the next few weeks.  However, should you experience any of the following, contact us immediately:  New numbness or weakness  Pain that is progressively getting worse, and is not relieved by your pain medications or rest  Bleeding, redness, swelling, pain, or drainage from surgical incision  Chills or flu-like symptoms  Fever greater than 101.0 F (38.3 C)  Problems with bowel or bladder functions  Difficulty breathing or shortness of breath  Warmth, tenderness, or  swelling in your calf  Contact Information  During office hours (Monday-Friday 9 am to 5 pm), please call your physician at [phone]  After hours and weekends, please call the [place] Operator at [phone] and ask for the Neurosurgery Resident On Call   For a life-threatening emergency, call [place]   1) The drugs that you were  given will stay in your system until tomorrow so for the next 24 hours you should not:  A) Drive an automobile B) Make any legal decisions C) Drink any alcoholic beverage   2) You may resume regular meals tomorrow.  Today it is better to start with liquids and gradually work up to solid foods.  You may eat anything you prefer, but it is better to start with liquids, then soup and crackers, and gradually work up to solid foods.   3) Please notify your doctor immediately if you have any unusual bleeding, trouble breathing, redness and pain at the surgery site, drainage, fever, or pain not relieved by medication.    4) Additional Instructions:        Please contact your physician with any problems or Same Day Surgery at [phone], Monday through Friday 6 am to 4 pm, or [place] at [facility] number at [phone].

## 2018-11-11 ENCOUNTER — Encounter: Payer: Self-pay | Admitting: Neurosurgery

## 2018-11-11 NOTE — Anesthesia Postprocedure Evaluation (Signed)
Anesthesia Post Note  Patient: Kaitlyn Zuniga  Procedure(s) Performed: LEFT L3-4, L4-5 HEMILAMINECTOMY & DISCECTOMY (N/A Back)  Patient location during evaluation: PACU Anesthesia Type: General Level of consciousness: awake and alert Pain management: pain level controlled Vital Signs Assessment: post-procedure vital signs reviewed and stable Respiratory status: spontaneous breathing, nonlabored ventilation and respiratory function stable Cardiovascular status: blood pressure returned to baseline and stable Postop Assessment: no apparent nausea or vomiting Anesthetic complications: no     Last Vitals:  Vitals:   11/10/18 1458 11/10/18 1607  BP: 139/69 134/68  Pulse: 81 70  Resp: 18 16  Temp:  36.4 C  SpO2: 96% 96%    Last Pain:  Vitals:   11/11/18 0935  TempSrc:   PainSc: 0-No pain                 Durenda Hurt

## 2019-01-07 NOTE — Progress Notes (Deleted)
       Patient: Kaitlyn Zuniga Female    DOB: 20-Jul-1932   85 y.o.   MRN: [id number] Visit Date: 01/07/2019  Today's Provider: [name], MD   No chief complaint on file.  Subjective:     HPI   Hypercholesterolemia without hypertriglyceridemia From 06/23/2018-labs good.  Essential (primary) hypertension From 06/23/2018-labs good.  No Known Allergies   Current Outpatient Medications:  .  acetaminophen (TYLENOL) 500 MG tablet, Take 500 mg by mouth every 6 (six) hours as needed for mild pain., Disp: , Rfl:  .  Calcium Carb-Cholecalciferol (CALCIUM PLUS VITAMIN D3) 600-500 MG-UNIT CAPS, Take 1 tablet by mouth daily., Disp: , Rfl:  .  carboxymethylcellulose (REFRESH PLUS) 0.5 % SOLN, Place 1 drop into both eyes daily., Disp: , Rfl:  .  lisinopril (ZESTRIL) 20 MG tablet, Take 1 tablet by mouth once daily, Disp: 90 tablet, Rfl: 3 .  Lysine 500 MG TABS, Take 500 mg by mouth daily. , Disp: , Rfl:  .  MULTIPLE VITAMIN PO, Take 1 tablet by mouth daily. , Disp: , Rfl:  .  traMADol (ULTRAM) 50 MG tablet, Take 50 mg by mouth 2 (two) times daily as needed., Disp: , Rfl:   Review of Systems  Constitutional: Negative for appetite change, chills, fatigue and fever.  Respiratory: Negative for chest tightness and shortness of breath.   Cardiovascular: Negative for chest pain and palpitations.  Gastrointestinal: Negative for abdominal pain, nausea and vomiting.  Neurological: Negative for dizziness and weakness.    Social History   Tobacco Use  . Smoking status: Former Smoker    Years: 10.00  . Smokeless tobacco: Never Used  . Tobacco comment: quit 30 years ago.   Substance Use Topics  . Alcohol use: No    Alcohol/week: 0.0 standard drinks      Objective:   There were no vitals taken for this visit. There were no vitals filed for this visit.There is no height or weight on file to calculate BMI.   Physical Exam   No results found for any visits on 01/14/19.       Assessment & Plan        [name], MD  [facility]

## 2019-01-14 ENCOUNTER — Ambulatory Visit: Payer: Self-pay | Admitting: Family Medicine

## 2019-01-16 ENCOUNTER — Telehealth: Payer: Self-pay | Admitting: Family Medicine

## 2019-01-16 ENCOUNTER — Ambulatory Visit (INDEPENDENT_AMBULATORY_CARE_PROVIDER_SITE_OTHER): Payer: Medicare Other | Admitting: Physician Assistant

## 2019-01-16 ENCOUNTER — Encounter: Payer: Self-pay | Admitting: Physician Assistant

## 2019-01-16 VITALS — HR 74 | Temp 97.0°F

## 2019-01-16 DIAGNOSIS — J0140 Acute pansinusitis, unspecified: Secondary | ICD-10-CM

## 2019-01-16 MED ORDER — AMOXICILLIN-POT CLAVULANATE 875-125 MG PO TABS: 1.0000 | ORAL_TABLET | Freq: Two times a day (BID) | ORAL | 0 refills | Status: DC

## 2019-01-16 NOTE — Telephone Encounter (Signed)
Patient is calling she has been diagnosed with COVID- Patient is reporting a lot of phelm, and chilled. Patient is reqeusting an antibotic. Please advise Cb- [phone] Preferred [place]. CB- [phone]

## 2019-01-16 NOTE — Progress Notes (Signed)
Patient: Kaitlyn Zuniga Female    DOB: 10/13/1932   86 y.o.   MRN: [id number] Visit Date: 01/16/2019  Today's Provider: Mar Daring, PA-C   No chief complaint on file.  Subjective:     HPI   Virtual Visit via Telephone Note  I connected with Kaitlyn Zuniga on 01/16/19 at  2:20 PM EST by telephone and verified that I am speaking with the correct person using two identifiers.  Location: Patient: home Provider: home office in [place], [place]   I discussed the limitations, risks, security and privacy concerns of performing an evaluation and management service by telephone and the availability of in person appointments. I also discussed with the patient that there may be a patient responsible charge related to this service. The patient expressed understanding and agreed to proceed.  Pt tested positive for COVID-19 on 01/05/2019 at [facility]. Still having a dry cough with occasional production of mucous (more so at night). Also reports having more congestion in her sinus area and nasal congestion. She denies any continued fever. She does report she had been feeling bad for about a week when she was diagnosed with covid in December. She had started feeling better for about 3-4 days before the nasal congestion and sinus pressure started back up.     No Known Allergies   Current Outpatient Medications:  .  acetaminophen (TYLENOL) 500 MG tablet, Take 500 mg by mouth every 6 (six) hours as needed for mild pain., Disp: , Rfl:  .  Calcium Carb-Cholecalciferol (CALCIUM PLUS VITAMIN D3) 600-500 MG-UNIT CAPS, Take 1 tablet by mouth daily., Disp: , Rfl:  .  carboxymethylcellulose (REFRESH PLUS) 0.5 % SOLN, Place 1 drop into both eyes daily., Disp: , Rfl:  .  lisinopril (ZESTRIL) 20 MG tablet, Take 1 tablet by mouth once daily, Disp: 90 tablet, Rfl: 3 .  Lysine 500 MG TABS, Take 500 mg by mouth daily. , Disp: , Rfl:  .  MULTIPLE VITAMIN PO, Take 1 tablet by mouth  daily. , Disp: , Rfl:  .  traMADol (ULTRAM) 50 MG tablet, Take 50 mg by mouth 2 (two) times daily as needed., Disp: , Rfl:   Review of Systems  Constitutional: Positive for chills and fatigue. Negative for activity change, appetite change, diaphoresis, fever and unexpected weight change.  HENT: Positive for congestion, sinus pressure and sinus pain. Negative for ear discharge, ear pain, postnasal drip, rhinorrhea, sneezing, sore throat, tinnitus, trouble swallowing and voice change.   Eyes: Negative.   Respiratory: Positive for cough. Negative for apnea, choking, chest tightness, shortness of breath, wheezing and stridor.   Gastrointestinal: Positive for constipation. Negative for abdominal distention, abdominal pain, anal bleeding, blood in stool, diarrhea, nausea, rectal pain and vomiting.  Neurological: Positive for headaches. Negative for dizziness and light-headedness.    Social History   Tobacco Use  . Smoking status: Former Smoker    Years: 10.00  . Smokeless tobacco: Never Used  . Tobacco comment: quit 30 years ago.   Substance Use Topics  . Alcohol use: No    Alcohol/week: 0.0 standard drinks      Objective:   There were no vitals taken for this visit. There were no vitals filed for this visit.There is no height or weight on file to calculate BMI.   Physical Exam Vitals reviewed.  Constitutional:      General: She is not in acute distress. Pulmonary:     Effort: No respiratory  distress.  Neurological:     Mental Status: She is alert.      No results found for any visits on 01/16/19.     Assessment & Plan    1. Acute non-recurrent pansinusitis Worsening symptoms that have not responded to OTC medications. Will give augmentin as below. Continue allergy medications. Stay well hydrated and get plenty of rest. Call if no symptom improvement or if symptoms worsen. - amoxicillin-clavulanate (AUGMENTIN) 875-125 MG tablet; Take 1 tablet by mouth 2 (two) times daily.   Dispense: 14 tablet; Refill: 0    I discussed the assessment and treatment plan with the patient. The patient was provided an opportunity to ask questions and all were answered. The patient agreed with the plan and demonstrated an understanding of the instructions.   The patient was advised to call back or seek an in-person evaluation if the symptoms worsen or if the condition fails to improve as anticipated.  I provided 10 minutes of non-face-to-face time during this encounter.      Mar Daring, PA-C  [facility]

## 2019-01-16 NOTE — Telephone Encounter (Signed)
Appt made for patient

## 2019-01-17 ENCOUNTER — Ambulatory Visit: Payer: Self-pay | Admitting: Physician Assistant

## 2019-01-27 DIAGNOSIS — D225 Melanocytic nevi of trunk: Secondary | ICD-10-CM | POA: Diagnosis not present

## 2019-01-27 DIAGNOSIS — L821 Other seborrheic keratosis: Secondary | ICD-10-CM | POA: Diagnosis not present

## 2019-01-27 DIAGNOSIS — D2262 Melanocytic nevi of left upper limb, including shoulder: Secondary | ICD-10-CM | POA: Diagnosis not present

## 2019-01-27 DIAGNOSIS — D2261 Melanocytic nevi of right upper limb, including shoulder: Secondary | ICD-10-CM | POA: Diagnosis not present

## 2019-01-29 ENCOUNTER — Ambulatory Visit: Payer: Self-pay | Admitting: Family Medicine

## 2019-02-02 ENCOUNTER — Telehealth: Payer: Self-pay

## 2019-02-02 NOTE — Telephone Encounter (Signed)
Copied from [place] [id number]. Topic: General - Inquiry >> Feb 02, 2019  8:57 AM Richardo Priest, NT wrote: Reason for CRM: Pt called in stating she recently had covid-19 and just got out of quarantine on 01/18/2019. Pt would like advice as to how long she should wait before receiving vaccine. Please advise.

## 2019-02-02 NOTE — Telephone Encounter (Signed)
Patient advised to wait 30 days from her 1st symptom.

## 2019-02-16 NOTE — Progress Notes (Signed)
Patient: RAGENE INGRIM Female    DOB: 1932-09-28   86 y.o.   MRN: [id number] Visit Date: 02/17/2019  Today's Provider: [name], MD   Chief Complaint  Patient presents with  . Hypertension   Subjective:     HPI  Patient had Covid over Christmas and is fully recovered.  He had back surgery also that went well.  Blood pressures at home running in the [id number] systolic Hypercholesterolemia without hypertriglyceridemia From 06/23/2018-labs checked-normal.  Essential (primary) hypertension From 06/23/2018-labs checked-normal.  AKs From 06/23/2018-Per Dr Evorn Gong. Labs checked-normal.  No Known Allergies   Current Outpatient Medications:  .  acetaminophen (TYLENOL) 500 MG tablet, Take 500 mg by mouth every 6 (six) hours as needed for mild pain., Disp: , Rfl:  .  amoxicillin-clavulanate (AUGMENTIN) 875-125 MG tablet, Take 1 tablet by mouth 2 (two) times daily., Disp: 14 tablet, Rfl: 0 .  Calcium Carb-Cholecalciferol (CALCIUM PLUS VITAMIN D3) 600-500 MG-UNIT CAPS, Take 1 tablet by mouth daily., Disp: , Rfl:  .  carboxymethylcellulose (REFRESH PLUS) 0.5 % SOLN, Place 1 drop into both eyes daily., Disp: , Rfl:  .  lisinopril (ZESTRIL) 20 MG tablet, Take 1 tablet by mouth once daily, Disp: 90 tablet, Rfl: 3 .  Lysine 500 MG TABS, Take 500 mg by mouth daily. , Disp: , Rfl:  .  MULTIPLE VITAMIN PO, Take 1 tablet by mouth daily. , Disp: , Rfl:  .  traMADol (ULTRAM) 50 MG tablet, Take 50 mg by mouth 2 (two) times daily as needed., Disp: , Rfl:   Review of Systems  Constitutional: Negative for appetite change, chills, fatigue and fever.  HENT: Negative.   Eyes: Negative.   Respiratory: Negative for chest tightness and shortness of breath.   Cardiovascular: Negative for chest pain and palpitations.  Gastrointestinal: Negative for abdominal pain, nausea and vomiting.  Endocrine: Negative.   Musculoskeletal: Positive for back pain.  Allergic/Immunologic: Negative.    Neurological: Negative for dizziness and weakness.  Hematological: Negative.   Psychiatric/Behavioral: Negative.  Negative for behavioral problems.    Social History   Tobacco Use  . Smoking status: Former Smoker    Years: 10.00  . Smokeless tobacco: Never Used  . Tobacco comment: quit 30 years ago.   Substance Use Topics  . Alcohol use: No    Alcohol/week: 0.0 standard drinks      Objective:   There were no vitals taken for this visit. There were no vitals filed for this visit.There is no height or weight on file to calculate BMI.   Physical Exam Vitals reviewed.  Constitutional:      Appearance: She is well-developed.  HENT:     Head: Normocephalic and atraumatic.     Right Ear: External ear normal.     Left Ear: External ear normal.     Nose: Nose normal.  Eyes:     Conjunctiva/sclera: Conjunctivae normal.  Neck:     Thyroid: No thyromegaly.  Cardiovascular:     Rate and Rhythm: Normal rate and regular rhythm.     Heart sounds: Normal heart sounds.  Pulmonary:     Effort: Pulmonary effort is normal.     Breath sounds: Normal breath sounds.  Chest:     Breasts:        Right: Normal.        Left: Normal.  Abdominal:     Palpations: Abdomen is soft.  Genitourinary:    General: Normal vulva.  Rectum: Guaiac result negative.  Musculoskeletal:     Cervical back: Neck supple.  Skin:    General: Skin is warm and dry.     Comments: Very fair skin.  Neurological:     Mental Status: She is alert and oriented to person, place, and time. Mental status is at baseline.  Psychiatric:        Mood and Affect: Mood normal.        Behavior: Behavior normal.        Thought Content: Thought content normal.        Judgment: Judgment normal.      No results found for any visits on 02/17/19.     Assessment & Plan     1. Essential (primary) hypertension Controlled.  2. Mild cognitive disorder MMSE on next visit.  3. DDD (degenerative disc disease),  lumbar   4. COVID-19 virus infection Resolved.    Trena Platt Abron Neddo,acting as a scribe for [name], MD.,have documented all relevant documentation on the behalf of [name], MD,as directed by  [name], MD while in the presence of [name], MD.     [name], MD  [facility]

## 2019-02-17 ENCOUNTER — Ambulatory Visit (INDEPENDENT_AMBULATORY_CARE_PROVIDER_SITE_OTHER): Payer: Medicare Other | Admitting: Family Medicine

## 2019-02-17 ENCOUNTER — Other Ambulatory Visit: Payer: Self-pay

## 2019-02-17 ENCOUNTER — Encounter: Payer: Self-pay | Admitting: Family Medicine

## 2019-02-17 VITALS — BP 149/82 | HR 83 | Temp 96.6°F | Wt 159.0 lb

## 2019-02-17 DIAGNOSIS — M5136 Other intervertebral disc degeneration, lumbar region: Secondary | ICD-10-CM | POA: Diagnosis not present

## 2019-02-17 DIAGNOSIS — I10 Essential (primary) hypertension: Secondary | ICD-10-CM

## 2019-02-17 DIAGNOSIS — U071 COVID-19: Secondary | ICD-10-CM | POA: Diagnosis not present

## 2019-02-17 DIAGNOSIS — F09 Unspecified mental disorder due to known physiological condition: Secondary | ICD-10-CM | POA: Diagnosis not present

## 2019-03-05 DIAGNOSIS — H40003 Preglaucoma, unspecified, bilateral: Secondary | ICD-10-CM | POA: Diagnosis not present

## 2019-03-12 DIAGNOSIS — W57XXXA Bitten or stung by nonvenomous insect and other nonvenomous arthropods, initial encounter: Secondary | ICD-10-CM | POA: Diagnosis not present

## 2019-03-12 DIAGNOSIS — L03213 Periorbital cellulitis: Secondary | ICD-10-CM | POA: Diagnosis not present

## 2019-03-12 DIAGNOSIS — R0982 Postnasal drip: Secondary | ICD-10-CM | POA: Diagnosis not present

## 2019-03-12 DIAGNOSIS — S00262A Insect bite (nonvenomous) of left eyelid and periocular area, initial encounter: Secondary | ICD-10-CM | POA: Diagnosis not present

## 2019-03-26 DIAGNOSIS — J301 Allergic rhinitis due to pollen: Secondary | ICD-10-CM | POA: Diagnosis not present

## 2019-03-26 DIAGNOSIS — R490 Dysphonia: Secondary | ICD-10-CM | POA: Diagnosis not present

## 2019-04-15 NOTE — Progress Notes (Signed)
Subjective:   Kaitlyn Zuniga is a 86 y.o. female who presents for Medicare Annual (Subsequent) preventive examination.  Review of Systems:  N/A  Cardiac Risk Factors include: advanced age (>29men, >80 women);hypertension     Objective:     Vitals: BP 140/62 (BP Location: Right Arm)   Pulse 84   Temp 97.8 F (36.6 C) (Oral)   Ht 5\' 5"  (1.651 m)   Wt 162 lb 3.2 oz (73.6 kg)   BMI 26.99 kg/m   Body mass index is 26.99 kg/m.  Advanced Directives 04/16/2019 11/10/2018 11/06/2018 03/29/2016 09/29/2015 03/28/2015 06/29/2014  Does Patient Have a Medical Advance Directive? Yes Yes Yes Yes Yes No No  Type of Paramedic of [place];Living will [place];Living will [place];Living will [place];Living will Living will - -  Does patient want to make changes to medical advance directive? - No - Patient declined No - Patient declined - - - -  Copy of [place] in Chart? No - copy requested No - copy requested - No - copy requested - - -    Tobacco Social History   Tobacco Use  Smoking Status Former Smoker  . Years: 10.00  Smokeless Tobacco Never Used  Tobacco Comment   quit 30 years ago.      Counseling given: Not Answered Comment: quit 30 years ago.    Clinical Intake:  Pre-visit preparation completed: Yes  Pain : No/denies pain Pain Score: 0-No pain     Nutritional Status: BMI 25 -29 Overweight Nutritional Risks: None Diabetes: No  How often do you need to have someone help you when you read instructions, pamphlets, or other written materials from your doctor or pharmacy?: 1 - Never  Interpreter Needed?: No  Information entered by :: [facility], LPN  Past Medical History:  Diagnosis Date  . Arthritis   . Hypertension    Past Surgical History:  Procedure Laterality Date  . BREAST BIOPSY Right 2006   benign  . BREAST SURGERY Right [id number]   Biopsy  .  HEMI-MICRODISCECTOMY LUMBAR LAMINECTOMY LEVEL 2 N/A 11/10/2018   Procedure: LEFT L3-4, L4-5 HEMILAMINECTOMY & DISCECTOMY;  Surgeon: Deetta Perla, MD;  Location: [facility] ORS;  Service: Neurosurgery;  Laterality: N/A;   Family History  Problem Relation Age of Onset  . Cancer Mother        lung  . Cancer Father        prostate  . Hypertension Father   . Breast cancer Sister 44   Social History   Socioeconomic History  . Marital status: Married    Spouse name: Not on file  . Number of children: 2  . Years of education: Not on file  . Highest education level: 10th grade  Occupational History  . Occupation: retired  Tobacco Use  . Smoking status: Former Smoker    Years: 10.00  . Smokeless tobacco: Never Used  . Tobacco comment: quit 30 years ago.   Substance and Sexual Activity  . Alcohol use: No    Alcohol/week: 0.0 standard drinks  . Drug use: No  . Sexual activity: Not on file  Other Topics Concern  . Not on file  Social History Narrative  . Not on file   Social Determinants of Health   Financial Resource Strain: Low Risk   . Difficulty of Paying Living Expenses: Not hard at all  Food Insecurity: No Food Insecurity  . Worried About [employer] of  Food in the Last Year: Never true  . Ran Out of Food in the Last Year: Never true  Transportation Needs: No Transportation Needs  . Lack of Transportation (Medical): No  . Lack of Transportation (Non-Medical): No  Physical Activity: Inactive  . Days of Exercise per Week: 0 days  . Minutes of Exercise per Session: 0 min  Stress: No Stress Concern Present  . Feeling of Stress : Not at all  Social Connections: Slightly Isolated  . Frequency of Communication with Friends and Family: More than three times a week  . Frequency of Social Gatherings with Friends and Family: Once a week  . Attends Religious Services: More than 4 times per year  . Active Member of Clubs or Organizations: No  . Attends Archivist Meetings:  Never  . Marital Status: Married    Outpatient Encounter Medications as of 04/16/2019  Medication Sig  . acetaminophen (TYLENOL) 500 MG tablet Take 500 mg by mouth every 6 (six) hours as needed for mild pain.  . Calcium Carb-Cholecalciferol (CALCIUM PLUS VITAMIN D3) 600-500 MG-UNIT CAPS Take 1 tablet by mouth daily.  . carboxymethylcellulose (REFRESH PLUS) 0.5 % SOLN Place 1 drop into both eyes daily.  . fluticasone (FLONASE) 50 MCG/ACT nasal spray Place 1 spray into both nostrils as needed.   Marland Kitchen lisinopril (ZESTRIL) 20 MG tablet Take 1 tablet by mouth once daily  . Lysine 500 MG TABS Take 500 mg by mouth daily.   . MULTIPLE VITAMIN PO Take 1 tablet by mouth daily.   . traMADol (ULTRAM) 50 MG tablet Take 50 mg by mouth 2 (two) times daily as needed.  Marland Kitchen amoxicillin-clavulanate (AUGMENTIN) 875-125 MG tablet Take 1 tablet by mouth 2 (two) times daily. (Patient not taking: Reported on 04/16/2019)   No facility-administered encounter medications on file as of 04/16/2019.    Activities of Daily Living In your present state of health, do you have any difficulty performing the following activities: 04/16/2019 11/06/2018  Hearing? Y N  Comment Wears a hearing aid in the left ear. -  Vision? N N  Difficulty concentrating or making decisions? Y N  Walking or climbing stairs? Y Y  Comment Due to knee pains. back pain  Dressing or bathing? N N  Doing errands, shopping? N N  Preparing Food and eating ? N -  Using the Toilet? N -  In the past six months, have you accidently leaked urine? N -  Do you have problems with loss of bowel control? N -  Managing your Medications? N -  Managing your Finances? N -  Housekeeping or managing your Housekeeping? N -  Some recent data might be hidden    Patient Care Team: Jerrol Banana., MD as PCP - General (Family Medicine) Dasher, Rayvon Char, MD (Dermatology) Clyde Canterbury, MD as Referring Physician (Otolaryngology) Kipp Laurence, MD as Referring  Physician (Audiology) Leandrew Koyanagi, MD as Referring Physician (Ophthalmology) Marlowe Sax, MD as Referring Physician (Internal Medicine)    Assessment:   This is a routine wellness examination for Kaitlyn Zuniga.  Exercise Activities and Dietary recommendations Current Exercise Habits: Home exercise routine, Type of exercise: stretching, Time (Minutes): 10(to 15 minutes), Frequency (Times/Week): 5, Weekly Exercise (Minutes/Week): 50, Intensity: Mild, Exercise limited by: orthopedic condition(s)  Goals    . DIET - INCREASE WATER INTAKE     Recommend to drink at least 6-8 8oz glasses of water per day.       Fall Risk: Fall Risk  04/16/2019  06/23/2018 07/03/2017 01/30/2017 10/01/2016  Falls in the past year? 0 0 No No No  Number falls in past yr: 0 - - - -  Injury with Fall? 0 - - - -    FALL RISK PREVENTION PERTAINING TO THE HOME:  Any stairs in or around the home? Yes  If so, are there any without handrails? No   Home free of loose throw rugs in walkways, pet beds, electrical cords, etc? Yes  Adequate lighting in your home to reduce risk of falls? Yes   ASSISTIVE DEVICES UTILIZED TO PREVENT FALLS:  Life alert? No  Use of a cane, walker or w/c? No  Grab bars in the bathroom? No  Shower chair or bench in shower? No  Elevated toilet seat or a handicapped toilet? No    TIMED UP AND GO:  Was the test performed? No .    Depression Screen PHQ 2/9 Scores 04/16/2019 06/23/2018 07/03/2017 01/30/2017  PHQ - 2 Score 0 0 0 0  PHQ- 9 Score - 1 - -     Cognitive Function: Declined today.  MMSE - Mini Mental State Exam 07/03/2017  Orientation to time 5  Orientation to Place 5  Registration 3  Attention/ Calculation 5  Recall 2  Language- name 2 objects 2  Language- repeat 1  Language- follow 3 step command 3  Language- read & follow direction 1  Write a sentence 1  Copy design 1  Total score 29        Immunization History  Administered Date(s) Administered  .  Influenza, High Dose Seasonal PF 09/28/2014, 09/29/2015, 10/01/2016, 12/26/2017  . Influenza-Unspecified 09/26/2018  . Pneumococcal Conjugate-13 09/24/2013  . Pneumococcal Polysaccharide-23 11/16/1999  . Td 06/06/2004  . Tdap 10/18/2010  . Zoster 01/10/2006    Qualifies for Shingles Vaccine? Yes  Zostavax completed 01/10/06. Due for Shingrix. Pt has been advised to call insurance company to determine out of pocket expense. Advised may also receive vaccine at local pharmacy or Health Dept. Verbalized acceptance and understanding.  Tdap: Up to date  Flu Vaccine: Up to date  Pneumococcal Vaccine: Completed series  Screening Tests Health Maintenance  Topic Date Due  . INFLUENZA VACCINE  08/09/2019  . TETANUS/TDAP  10/17/2020  . DEXA SCAN  11/07/2021  . PNA vac Low Risk Adult  Completed    Cancer Screenings:  Colorectal Screening: No longer required.   Mammogram: No longer required.   Bone Density: Completed 11/07/16. Results reflect OSTEOPENIA. Repeat every 5 years.   Lung Cancer Screening: (Low Dose CT Chest recommended if Age 24-80 years, 30 pack-year currently smoking OR have quit w/in 15years.) does not qualify.   Additional Screening:  Vision Screening: Recommended annual ophthalmology exams for early detection of glaucoma and other disorders of the eye.  Dental Screening: Recommended annual dental exams for proper oral hygiene  Community Resource Referral:  CRR required this visit?  No       Plan:  I have personally reviewed and addressed the Medicare Annual Wellness questionnaire and have noted the following in the patient's chart:  A. Medical and social history B. Use of alcohol, tobacco or illicit drugs  C. Current medications and supplements D. Functional ability and status E.  Nutritional status F.  Physical activity G. Advance directives H. List of other physicians I.  Hospitalizations, surgeries, and ER visits in previous 12 months J.   [place] such as hearing and vision if needed, cognitive and depression L. Referrals and appointments  In addition, I have reviewed and discussed with patient certain preventive protocols, quality metrics, and best practice recommendations. A written personalized care plan for preventive services as well as general preventive health recommendations were provided to patient. Nurse Health Advisor  Signed,    Shenice Dolder [place], [place]  [id number] Nurse Health Advisor   Nurse Notes: None.

## 2019-04-16 ENCOUNTER — Other Ambulatory Visit: Payer: Self-pay

## 2019-04-16 ENCOUNTER — Ambulatory Visit (INDEPENDENT_AMBULATORY_CARE_PROVIDER_SITE_OTHER): Payer: Medicare Other

## 2019-04-16 VITALS — BP 140/62 | HR 84 | Temp 97.8°F | Ht 65.0 in | Wt 162.2 lb

## 2019-04-16 DIAGNOSIS — Z0000 Encounter for general adult medical examination without abnormal findings: Secondary | ICD-10-CM

## 2019-04-16 NOTE — Patient Instructions (Signed)
Kaitlyn Zuniga , Thank you for taking time to come for your Medicare Wellness Visit. I appreciate your ongoing commitment to your health goals. Please review the following plan we discussed and let me know if I can assist you in the future.   Screening recommendations/referrals: Colonoscopy: No longer required.  Mammogram: No longer required.  Bone Density: Up to date, due 10/2021 Recommended yearly ophthalmology/optometry visit for glaucoma screening and checkup Recommended yearly dental visit for hygiene and checkup  Vaccinations: Influenza vaccine: Up to date Pneumococcal vaccine: Completed series Tdap vaccine: Up to date Shingles vaccine: Pt declines today.     Advanced directives: Please bring a copy of your POA (Power of Attorney) and/or Living Will to your next appointment.   Conditions/risks identified: Recommend to drink at least 6-8 8oz glasses of water per day.  Next appointment: 07/22/19 @ 9:40 AM with Dr Rosanna Randy. Declined scheduling an AWV for 2022 at this time.   Preventive Care 26 Years and Older, Female Preventive care refers to lifestyle choices and visits with your health care provider that can promote health and wellness. What does preventive care include?  A yearly physical exam. This is also called an annual well check.  Dental exams once or twice a year.  Routine eye exams. Ask your health care provider how often you should have your eyes checked.  Personal lifestyle choices, including:  Daily care of your teeth and gums.  Regular physical activity.  Eating a healthy diet.  Avoiding tobacco and drug use.  Limiting alcohol use.  Practicing safe sex.  Taking low-dose aspirin every day.  Taking vitamin and mineral supplements as recommended by your health care provider. What happens during an annual well check? The services and screenings done by your health care provider during your annual well check will depend on your age, overall health, lifestyle  risk factors, and family history of disease. Counseling  Your health care provider may ask you questions about your:  Alcohol use.  Tobacco use.  Drug use.  Emotional well-being.  Home and relationship well-being.  Sexual activity.  Eating habits.  History of falls.  Memory and ability to understand (cognition).  Work and work Statistician.  Reproductive health. Screening  You may have the following tests or measurements:  Height, weight, and BMI.  Blood pressure.  Lipid and cholesterol levels. These may be checked every 5 years, or more frequently if you are over 45 years old.  Skin check.  Lung cancer screening. You may have this screening every year starting at age 61 if you have a 30-pack-year history of smoking and currently smoke or have quit within the past 15 years.  Fecal occult blood test (FOBT) of the stool. You may have this test every year starting at age 54.  Flexible sigmoidoscopy or colonoscopy. You may have a sigmoidoscopy every 5 years or a colonoscopy every 10 years starting at age 88.  Hepatitis C blood test.  Hepatitis B blood test.  Sexually transmitted disease (STD) testing.  Diabetes screening. This is done by checking your blood sugar (glucose) after you have not eaten for a while (fasting). You may have this done every 1-3 years.  Bone density scan. This is done to screen for osteoporosis. You may have this done starting at age 28.  Mammogram. This may be done every 1-2 years. Talk to your health care provider about how often you should have regular mammograms. Talk with your health care provider about your test results, treatment options, and if  necessary, the need for more tests. Vaccines  Your health care provider may recommend certain vaccines, such as:  Influenza vaccine. This is recommended every year.  Tetanus, diphtheria, and acellular pertussis (Tdap, Td) vaccine. You may need a Td booster every 10 years.  Zoster vaccine.  You may need this after age 75.  Pneumococcal 13-valent conjugate (PCV13) vaccine. One dose is recommended after age 59.  Pneumococcal polysaccharide (PPSV23) vaccine. One dose is recommended after age 83. Talk to your health care provider about which screenings and vaccines you need and how often you need them. This information is not intended to replace advice given to you by your health care provider. Make sure you discuss any questions you have with your health care provider. Document Released: 01/21/2015 Document Revised: 09/14/2015 Document Reviewed: 10/26/2014 Elsevier Interactive Patient Education  2017 [place] Prevention in the Home Falls can cause injuries. They can happen to people of all ages. There are many things you can do to make your home safe and to help prevent falls. What can I do on the outside of my home?  Regularly fix the edges of walkways and driveways and fix any cracks.  Remove anything that might make you trip as you walk through a door, such as a raised step or threshold.  Trim any bushes or trees on the path to your home.  Use bright outdoor lighting.  Clear any walking paths of anything that might make someone trip, such as rocks or tools.  Regularly check to see if handrails are loose or broken. Make sure that both sides of any steps have handrails.  Any raised decks and porches should have guardrails on the edges.  Have any leaves, snow, or ice cleared regularly.  Use sand or salt on walking paths during winter.  Clean up any spills in your garage right away. This includes oil or grease spills. What can I do in the bathroom?  Use night lights.  Install grab bars by the toilet and in the tub and shower. Do not use towel bars as grab bars.  Use non-skid mats or decals in the tub or shower.  If you need to sit down in the shower, use a plastic, non-slip stool.  Keep the floor dry. Clean up any water that spills on the floor as soon  as it happens.  Remove soap buildup in the tub or shower regularly.  Attach bath mats securely with double-sided non-slip rug tape.  Do not have throw rugs and other things on the floor that can make you trip. What can I do in the bedroom?  Use night lights.  Make sure that you have a light by your bed that is easy to reach.  Do not use any sheets or blankets that are too big for your bed. They should not hang down onto the floor.  Have a firm chair that has side arms. You can use this for support while you get dressed.  Do not have throw rugs and other things on the floor that can make you trip. What can I do in the kitchen?  Clean up any spills right away.  Avoid walking on wet floors.  Keep items that you use a lot in easy-to-reach places.  If you need to reach something above you, use a strong step stool that has a grab bar.  Keep electrical cords out of the way.  Do not use floor polish or wax that makes floors slippery. If you must  use wax, use non-skid floor wax.  Do not have throw rugs and other things on the floor that can make you trip. What can I do with my stairs?  Do not leave any items on the stairs.  Make sure that there are handrails on both sides of the stairs and use them. Fix handrails that are broken or loose. Make sure that handrails are as long as the stairways.  Check any carpeting to make sure that it is firmly attached to the stairs. Fix any carpet that is loose or worn.  Avoid having throw rugs at the top or bottom of the stairs. If you do have throw rugs, attach them to the floor with carpet tape.  Make sure that you have a light switch at the top of the stairs and the bottom of the stairs. If you do not have them, ask someone to add them for you. What else can I do to help prevent falls?  Wear shoes that:  Do not have high heels.  Have rubber bottoms.  Are comfortable and fit you well.  Are closed at the toe. Do not wear sandals.  If  you use a stepladder:  Make sure that it is fully opened. Do not climb a closed stepladder.  Make sure that both sides of the stepladder are locked into place.  Ask someone to hold it for you, if possible.  Clearly mark and make sure that you can see:  Any grab bars or handrails.  First and last steps.  Where the edge of each step is.  Use tools that help you move around (mobility aids) if they are needed. These include:  Canes.  Walkers.  Scooters.  Crutches.  Turn on the lights when you go into a dark area. Replace any light bulbs as soon as they burn out.  Set up your furniture so you have a clear path. Avoid moving your furniture around.  If any of your floors are uneven, fix them.  If there are any pets around you, be aware of where they are.  Review your medicines with your doctor. Some medicines can make you feel dizzy. This can increase your chance of falling. Ask your doctor what other things that you can do to help prevent falls. This information is not intended to replace advice given to you by your health care provider. Make sure you discuss any questions you have with your health care provider. Document Released: 10/21/2008 Document Revised: 06/02/2015 Document Reviewed: 01/29/2014 Elsevier Interactive Patient Education  2017 [employer].

## 2019-05-08 DIAGNOSIS — L03115 Cellulitis of right lower limb: Secondary | ICD-10-CM | POA: Diagnosis not present

## 2019-05-08 DIAGNOSIS — L03113 Cellulitis of right upper limb: Secondary | ICD-10-CM | POA: Diagnosis not present

## 2019-05-08 DIAGNOSIS — T63481A Toxic effect of venom of other arthropod, accidental (unintentional), initial encounter: Secondary | ICD-10-CM | POA: Diagnosis not present

## 2019-07-21 NOTE — Progress Notes (Signed)
Trena Platt Sherol Sabas,acting as a scribe for [name], MD.,have documented all relevant documentation on the behalf of [name], MD,as directed by  [name], MD while in the presence of [name], MD.  Complete physical exam   Patient: Kaitlyn Zuniga   DOB: 1932-06-01   87 y.o. Female  MRN: [id number] Visit Date: 07/22/2019  Today's healthcare provider: [name], MD   Chief Complaint  Patient presents with  . Annual Exam   Subjective    Leata CANDICE TOBEY is a 86 y.o. female who presents today for a complete physical exam.  She reports consuming a general diet. Exercise is limited by orthopedic condition(s): back pain. She generally feels fairly well. She reports sleeping fairly well. She does not have additional problems to discuss today.  HPI  Patient had AWV with [facility] on 4/08/20021.  Past Medical History:  Diagnosis Date  . Arthritis   . Hypertension    Past Surgical History:  Procedure Laterality Date  . BREAST BIOPSY Right 2006   benign  . BREAST SURGERY Right [id number]   Biopsy  . HEMI-MICRODISCECTOMY LUMBAR LAMINECTOMY LEVEL 2 N/A 11/10/2018   Procedure: LEFT L3-4, L4-5 HEMILAMINECTOMY & DISCECTOMY;  Surgeon: Deetta Perla, MD;  Location: [facility] ORS;  Service: Neurosurgery;  Laterality: N/A;   Social History   Socioeconomic History  . Marital status: Married    Spouse name: Not on file  . Number of children: 2  . Years of education: Not on file  . Highest education level: 10th grade  Occupational History  . Occupation: retired  Tobacco Use  . Smoking status: Former Smoker    Years: 10.00  . Smokeless tobacco: Never Used  . Tobacco comment: quit 30 years ago.   Vaping Use  . Vaping Use: Never used  Substance and Sexual Activity  . Alcohol use: No    Alcohol/week: 0.0 standard drinks  . Drug use: No  . Sexual activity: Not on file  Other Topics Concern  . Not on file  Social History Narrative  . Not on file    Social Determinants of Health   Financial Resource Strain: Low Risk   . Difficulty of Paying Living Expenses: Not hard at all  Food Insecurity: No Food Insecurity  . Worried About Charity fundraiser in the Last Year: Never true  . Ran Out of Food in the Last Year: Never true  Transportation Needs: No Transportation Needs  . Lack of Transportation (Medical): No  . Lack of Transportation (Non-Medical): No  Physical Activity: Inactive  . Days of Exercise per Week: 0 days  . Minutes of Exercise per Session: 0 min  Stress: No Stress Concern Present  . Feeling of Stress : Not at all  Social Connections: Moderately Integrated  . Frequency of Communication with Friends and Family: More than three times a week  . Frequency of Social Gatherings with Friends and Family: Once a week  . Attends Religious Services: More than 4 times per year  . Active Member of Clubs or Organizations: No  . Attends Archivist Meetings: Never  . Marital Status: Married  Human resources officer Violence: Not At Risk  . Fear of Current or Ex-Partner: No  . Emotionally Abused: No  . Physically Abused: No  . Sexually Abused: No   Family Status  Relation Name Status  . Mother  Deceased at age 91       cause of death was lung cancer  .  Father  Deceased at age 41       cause of death was prostate cancer  . Sister  Deceased at age 15       cause of death was breast cancer  . Son  Alive  . Son  Alive  . Sister  Deceased at age 59       cause of death was lung cancer  . Brother  Deceased at age 62       cause of death was MVA  . Brother  Deceased       cause of death was surgery from gun shot wound  . Sister  Deceased at age 71       cause of death was pneumonia   Family History  Problem Relation Age of Onset  . Cancer Mother        lung  . Cancer Father        prostate  . Hypertension Father   . Breast cancer Sister 26   No Known Allergies  Patient Care Team: Jerrol Banana., MD as PCP  - General (Family Medicine) Dasher, Rayvon Char, MD (Dermatology) Clyde Canterbury, MD as Referring Physician (Otolaryngology) Kipp Laurence, MD as Referring Physician (Audiology) Leandrew Koyanagi, MD as Referring Physician (Ophthalmology) Marlowe Sax, MD as Referring Physician (Internal Medicine)   Medications: Outpatient Medications Prior to Visit  Medication Sig  . acetaminophen (TYLENOL) 500 MG tablet Take 500 mg by mouth every 6 (six) hours as needed for mild pain.  Marland Kitchen amoxicillin-clavulanate (AUGMENTIN) 875-125 MG tablet Take 1 tablet by mouth 2 (two) times daily. (Patient not taking: Reported on 04/16/2019)  . Calcium Carb-Cholecalciferol (CALCIUM PLUS VITAMIN D3) 600-500 MG-UNIT CAPS Take 1 tablet by mouth daily.  . carboxymethylcellulose (REFRESH PLUS) 0.5 % SOLN Place 1 drop into both eyes daily.  . fluticasone (FLONASE) 50 MCG/ACT nasal spray Place 1 spray into both nostrils as needed.   Marland Kitchen lisinopril (ZESTRIL) 20 MG tablet Take 1 tablet by mouth once daily  . Lysine 500 MG TABS Take 500 mg by mouth daily.   . MULTIPLE VITAMIN PO Take 1 tablet by mouth daily.   . traMADol (ULTRAM) 50 MG tablet Take 50 mg by mouth 2 (two) times daily as needed.   No facility-administered medications prior to visit.    Review of Systems  Constitutional: Negative.   HENT: Positive for hearing loss and sinus pressure.   Eyes: Negative.   Respiratory: Negative.   Cardiovascular: Positive for chest pain.  Gastrointestinal: Negative.   Endocrine: Negative.   Genitourinary: Negative.   Musculoskeletal: Positive for arthralgias and back pain.  Skin: Negative.   Allergic/Immunologic: Negative.   Neurological: Positive for numbness.  Hematological: Bruises/bleeds easily.  Psychiatric/Behavioral: Positive for sleep disturbance.       Objective    BP 130/71 (BP Location: Right Arm, Patient Position: Sitting, Cuff Size: Large)   Pulse 80   Temp (!) 96.6 F (35.9 C) (Temporal)    Ht 5\' 5"  (1.651 m)   Wt 161 lb 9.6 oz (73.3 kg)   BMI 26.89 kg/m  BP Readings from Last 3 Encounters:  07/22/19 130/71  04/16/19 140/62  02/17/19 (!) 149/82   Wt Readings from Last 3 Encounters:  07/22/19 161 lb 9.6 oz (73.3 kg)  04/16/19 162 lb 3.2 oz (73.6 kg)  02/17/19 159 lb (72.1 kg)      Physical Exam Vitals reviewed.  Constitutional:      Appearance: Normal appearance.  HENT:  Head: Normocephalic and atraumatic.     Right Ear: Tympanic membrane normal.     Left Ear: Tympanic membrane normal.     Mouth/Throat:     Mouth: Mucous membranes are moist.     Pharynx: Oropharynx is clear.  Eyes:     General: No scleral icterus.    Conjunctiva/sclera: Conjunctivae normal.  Neck:     Vascular: No carotid bruit.  Cardiovascular:     Rate and Rhythm: Normal rate and regular rhythm.     Pulses: Normal pulses.     Heart sounds: Normal heart sounds.  Pulmonary:     Effort: Pulmonary effort is normal.     Breath sounds: Normal breath sounds.  Abdominal:     General: Bowel sounds are normal.     Palpations: Abdomen is soft.  Musculoskeletal:     Cervical back: Normal range of motion and neck supple.     Right lower leg: No edema.     Left lower leg: No edema.  Skin:    General: Skin is warm and dry.     Comments: Very fair skin.  Neurological:     General: No focal deficit present.     Mental Status: She is alert and oriented to person, place, and time.  Psychiatric:        Mood and Affect: Mood normal.        Behavior: Behavior normal.        Thought Content: Thought content normal.        Judgment: Judgment normal.   Patient declines breast exam and pelvic and rectal are declined also   Last depression screening scores PHQ 2/9 Scores 07/22/2019 04/16/2019 06/23/2018  PHQ - 2 Score 0 0 0  PHQ- 9 Score 1 - 1   Last fall risk screening Fall Risk  07/22/2019  Falls in the past year? 0  Number falls in past yr: 0  Injury with Fall? 0  Follow up Falls evaluation  completed   Last Audit-C alcohol use screening Alcohol Use Disorder Test (AUDIT) 07/22/2019  1. How often do you have a drink containing alcohol? 0  2. How many drinks containing alcohol do you have on a typical day when you are drinking? 0  3. How often do you have six or more drinks on one occasion? 0  AUDIT-C Score 0  Alcohol Brief Interventions/Follow-up -   A score of 3 or more in women, and 4 or more in men indicates increased risk for alcohol abuse, EXCEPT if all of the points are from question 1   No results found for any visits on 07/22/19.  Assessment & Plan    Routine Health Maintenance and Physical Exam  Exercise Activities and Dietary recommendations Goals    . DIET - INCREASE WATER INTAKE     Recommend to drink at least 6-8 8oz glasses of water per day.       Immunization History  Administered Date(s) Administered  . Influenza, High Dose Seasonal PF 09/28/2014, 09/29/2015, 10/01/2016, 12/26/2017  . Influenza-Unspecified 09/26/2018  . PFIZER SARS-COV-2 Vaccination 02/09/2019, 03/02/2019  . Pneumococcal Conjugate-13 09/24/2013  . Pneumococcal Polysaccharide-23 11/16/1999  . Td 06/06/2004  . Tdap 10/18/2010  . Zoster 01/10/2006  . Zoster Recombinat (Shingrix) 06/12/2019    Health Maintenance  Topic Date Due  . INFLUENZA VACCINE  08/09/2019  . TETANUS/TDAP  10/17/2020  . DEXA SCAN  11/07/2021  . COVID-19 Vaccine  Completed  . PNA vac Low Risk Adult  Completed  Discussed health benefits of physical activity, and encouraged her to engage in regular exercise appropriate for her age and condition.  Problem List Items Addressed This Visit      Cardiovascular and Mediastinum   Essential (primary) hypertension - Primary    Stable  Continue current medications Follow up in Nov/Dec Check routine labs      Relevant Orders   CBC with Differential/Platelet   Comprehensive metabolic panel   TSH   Lipid panel     Musculoskeletal and Integument    Arthritis, degenerative   Relevant Orders   CBC with Differential/Platelet   Comprehensive metabolic panel   TSH   Lipid panel     Other   Knee pain    Referred to ortho Xray of left knee      Relevant Orders   Ambulatory referral to Orthopedic Surgery   DG Knee Complete 4 Views Left      Return in about 5 months (around 12/22/2019).     I, [name], MD, have reviewed all documentation for this visit. The documentation on 07/25/19 for the exam, diagnosis, procedures, and orders are all accurate and complete.    Richard Cranford Mon, MD  [facility] [phone] (phone) [phone] (fax)  [place]

## 2019-07-22 ENCOUNTER — Ambulatory Visit: Payer: Medicare Other | Admitting: Family Medicine

## 2019-07-22 ENCOUNTER — Encounter: Payer: Self-pay | Admitting: Family Medicine

## 2019-07-22 ENCOUNTER — Other Ambulatory Visit: Payer: Self-pay

## 2019-07-22 VITALS — BP 130/71 | HR 80 | Temp 96.6°F | Ht 65.0 in | Wt 161.6 lb

## 2019-07-22 DIAGNOSIS — G5603 Carpal tunnel syndrome, bilateral upper limbs: Secondary | ICD-10-CM | POA: Diagnosis not present

## 2019-07-22 DIAGNOSIS — M25569 Pain in unspecified knee: Secondary | ICD-10-CM | POA: Diagnosis not present

## 2019-07-22 DIAGNOSIS — Z0000 Encounter for general adult medical examination without abnormal findings: Secondary | ICD-10-CM

## 2019-07-22 DIAGNOSIS — M1909 Primary osteoarthritis, other specified site: Secondary | ICD-10-CM | POA: Diagnosis not present

## 2019-07-22 DIAGNOSIS — M7061 Trochanteric bursitis, right hip: Secondary | ICD-10-CM | POA: Diagnosis not present

## 2019-07-22 DIAGNOSIS — I10 Essential (primary) hypertension: Secondary | ICD-10-CM | POA: Diagnosis not present

## 2019-07-22 NOTE — Assessment & Plan Note (Addendum)
Stable  Continue current medications Follow up in Nov/Dec Check routine labs

## 2019-07-22 NOTE — Assessment & Plan Note (Signed)
Referred to ortho Xray of left knee

## 2019-07-23 LAB — COMPREHENSIVE METABOLIC PANEL
ALT: 10 IU/L (ref 0–32)
AST: 21 IU/L (ref 0–40)
Albumin/Globulin Ratio: 1.6 (ref 1.2–2.2)
Albumin: 4.4 g/dL (ref 3.6–4.6)
Alkaline Phosphatase: 58 IU/L (ref 48–121)
BUN/Creatinine Ratio: 16 (ref 12–28)
BUN: 15 mg/dL (ref 8–27)
Bilirubin Total: 0.6 mg/dL (ref 0.0–1.2)
CO2: 24 mmol/L (ref 20–29)
Calcium: 9.6 mg/dL (ref 8.7–10.3)
Chloride: 103 mmol/L (ref 96–106)
Creatinine, Ser: 0.94 mg/dL (ref 0.57–1.00)
GFR calc Af Amer: 64 mL/min/{1.73_m2} (ref >59)
GFR calc non Af Amer: 55 mL/min/{1.73_m2} — ABNORMAL LOW (ref >59)
Globulin, Total: 2.7 g/dL (ref 1.5–4.5)
Glucose: 94 mg/dL (ref 65–99)
Potassium: 4.6 mmol/L (ref 3.5–5.2)
Sodium: 139 mmol/L (ref 134–144)
Total Protein: 7.1 g/dL (ref 6.0–8.5)

## 2019-07-23 LAB — CBC WITH DIFFERENTIAL/PLATELET
Basophils Absolute: 0 10*3/uL (ref 0.0–0.2)
Basos: 0 %
EOS (ABSOLUTE): 0 10*3/uL (ref 0.0–0.4)
Eos: 1 %
Hematocrit: 37.1 % (ref 34.0–46.6)
Hemoglobin: 12.5 g/dL (ref 11.1–15.9)
Immature Grans (Abs): 0.1 10*3/uL (ref 0.0–0.1)
Immature Granulocytes: 1 %
Lymphocytes Absolute: 1.4 10*3/uL (ref 0.7–3.1)
Lymphs: 34 %
MCH: 30.6 pg (ref 26.6–33.0)
MCHC: 33.7 g/dL (ref 31.5–35.7)
MCV: 91 fL (ref 79–97)
Monocytes Absolute: 0.7 10*3/uL (ref 0.1–0.9)
Monocytes: 16 %
Neutrophils Absolute: 2 10*3/uL (ref 1.4–7.0)
Neutrophils: 48 %
Platelets: 181 10*3/uL (ref 150–450)
RBC: 4.08 x10E6/uL (ref 3.77–5.28)
RDW: 12.2 % (ref 11.7–15.4)
WBC: 4.2 10*3/uL (ref 3.4–10.8)

## 2019-07-23 LAB — LIPID PANEL
Chol/HDL Ratio: 4 ratio (ref 0.0–4.4)
Cholesterol, Total: 174 mg/dL (ref 100–199)
HDL: 44 mg/dL (ref >39)
LDL Chol Calc (NIH): 101 mg/dL — ABNORMAL HIGH (ref 0–99)
Triglycerides: 164 mg/dL — ABNORMAL HIGH (ref 0–149)
VLDL Cholesterol Cal: 29 mg/dL (ref 5–40)

## 2019-07-23 LAB — TSH: TSH: 1.27 u[IU]/mL (ref 0.450–4.500)

## 2019-07-24 ENCOUNTER — Telehealth: Payer: Self-pay

## 2019-07-24 NOTE — Telephone Encounter (Signed)
-----   Message from Jerrol Banana., MD sent at 07/24/2019  7:16 AM EDT ----- Labs all stable.

## 2019-07-24 NOTE — Telephone Encounter (Signed)
Patient advised of lab results

## 2019-08-03 ENCOUNTER — Other Ambulatory Visit: Payer: Self-pay | Admitting: Family Medicine

## 2019-08-03 DIAGNOSIS — Z1231 Encounter for screening mammogram for malignant neoplasm of breast: Secondary | ICD-10-CM

## 2019-08-24 DIAGNOSIS — M1712 Unilateral primary osteoarthritis, left knee: Secondary | ICD-10-CM | POA: Diagnosis not present

## 2019-08-24 DIAGNOSIS — M25562 Pain in left knee: Secondary | ICD-10-CM | POA: Diagnosis not present

## 2019-09-01 DIAGNOSIS — H40003 Preglaucoma, unspecified, bilateral: Secondary | ICD-10-CM | POA: Diagnosis not present

## 2019-09-08 ENCOUNTER — Other Ambulatory Visit: Payer: Self-pay | Admitting: Family Medicine

## 2019-09-08 DIAGNOSIS — I10 Essential (primary) hypertension: Secondary | ICD-10-CM

## 2019-09-08 DIAGNOSIS — H401121 Primary open-angle glaucoma, left eye, mild stage: Secondary | ICD-10-CM | POA: Diagnosis not present

## 2019-09-24 DIAGNOSIS — H401121 Primary open-angle glaucoma, left eye, mild stage: Secondary | ICD-10-CM | POA: Diagnosis not present

## 2019-10-16 DIAGNOSIS — H401121 Primary open-angle glaucoma, left eye, mild stage: Secondary | ICD-10-CM | POA: Diagnosis not present

## 2019-10-26 ENCOUNTER — Ambulatory Visit (LOCAL_COMMUNITY_HEALTH_CENTER): Payer: Self-pay

## 2019-10-26 ENCOUNTER — Other Ambulatory Visit: Payer: Self-pay

## 2019-10-26 DIAGNOSIS — Z23 Encounter for immunization: Secondary | ICD-10-CM

## 2019-11-04 ENCOUNTER — Ambulatory Visit
Admission: RE | Admit: 2019-11-04 | Discharge: 2019-11-04 | Disposition: A | Discharge status: Home / Self Care | Payer: Medicare Other | Source: Ambulatory Visit | Attending: Family Medicine | Admitting: Family Medicine

## 2019-11-04 ENCOUNTER — Other Ambulatory Visit: Payer: Self-pay

## 2019-11-04 DIAGNOSIS — Z1231 Encounter for screening mammogram for malignant neoplasm of breast: Secondary | ICD-10-CM | POA: Insufficient documentation

## 2019-11-14 DIAGNOSIS — B9689 Other specified bacterial agents as the cause of diseases classified elsewhere: Secondary | ICD-10-CM | POA: Diagnosis not present

## 2019-11-14 DIAGNOSIS — J019 Acute sinusitis, unspecified: Secondary | ICD-10-CM | POA: Diagnosis not present

## 2019-11-14 DIAGNOSIS — H66001 Acute suppurative otitis media without spontaneous rupture of ear drum, right ear: Secondary | ICD-10-CM | POA: Diagnosis not present

## 2019-11-30 DIAGNOSIS — D2271 Melanocytic nevi of right lower limb, including hip: Secondary | ICD-10-CM | POA: Diagnosis not present

## 2019-11-30 DIAGNOSIS — D2262 Melanocytic nevi of left upper limb, including shoulder: Secondary | ICD-10-CM | POA: Diagnosis not present

## 2019-11-30 DIAGNOSIS — D2261 Melanocytic nevi of right upper limb, including shoulder: Secondary | ICD-10-CM | POA: Diagnosis not present

## 2019-11-30 DIAGNOSIS — D225 Melanocytic nevi of trunk: Secondary | ICD-10-CM | POA: Diagnosis not present

## 2019-12-07 ENCOUNTER — Telehealth: Payer: Self-pay

## 2019-12-07 NOTE — Telephone Encounter (Signed)
Copied from [place] [phone]. Topic: General - Other >> Dec 02, 2019  4:26 PM Leward Quan A wrote: Reason for CRM: Patient called in to inform Dr Rosanna Randy that she need a referral to a Urologist. Tried to schedule an appointment but nothing available till 12/22/19. Please call patient for further information Ph# [phone]

## 2019-12-07 NOTE — Telephone Encounter (Signed)
Returned call to patient . Patient stated she is going to hold off on referral and discuss it with Dr. Rosanna Randy at her upcoming appt.

## 2019-12-18 ENCOUNTER — Other Ambulatory Visit: Payer: Self-pay

## 2019-12-18 ENCOUNTER — Ambulatory Visit: Payer: Medicare Other | Admitting: Physician Assistant

## 2019-12-18 ENCOUNTER — Encounter: Payer: Self-pay | Admitting: Physician Assistant

## 2019-12-18 VITALS — BP 148/80 | HR 84 | Temp 97.5°F | Wt 164.3 lb

## 2019-12-18 DIAGNOSIS — N819 Female genital prolapse, unspecified: Secondary | ICD-10-CM

## 2019-12-18 NOTE — Progress Notes (Signed)
Established patient visit   Patient: Kaitlyn Zuniga   DOB: [birth year]   86 y.o. Female  MRN: [id number] Visit Date: 12/18/2019  Today's healthcare provider: Trinna Post, PA-C   Chief Complaint  Patient presents with  . Pelvic Pain  I,Ludell Zacarias M Laportia Carley,acting as a scribe for Trinna Post, PA-C.,have documented all relevant documentation on the behalf of Trinna Post, PA-C,as directed by  Trinna Post, PA-C while in the presence of Trinna Post, PA-C.  Subjective    Pelvic Pain The patient's primary symptoms include pelvic pain. This is a new problem. The current episode started 1 to 4 weeks ago. The problem occurs constantly. The problem has been unchanged. The patient is experiencing no pain. Pertinent negatives include no abdominal pain, back pain, constipation, diarrhea, discolored urine, dysuria, flank pain, frequency, headaches, joint pain, nausea, painful intercourse, rash, urgency or vomiting. Nothing aggravates the symptoms. She has tried nothing for the symptoms. The treatment provided no relief.    Reports visually seeing a visual bulge. She has her uterus. Two boys 8 pound 14 ounces and another boy aged 7 pounds 14 ounces.   No vaginal bleeding, no vaginal discharge      Medications: Outpatient Medications Prior to Visit  Medication Sig  . acetaminophen (TYLENOL) 500 MG tablet Take 500 mg by mouth every 6 (six) hours as needed for mild pain.  . Calcium Carb-Cholecalciferol (CALCIUM PLUS VITAMIN D3) 600-500 MG-UNIT CAPS Take 1 tablet by mouth daily.  . carboxymethylcellulose (REFRESH PLUS) 0.5 % SOLN Place 1 drop into both eyes daily.  . fluticasone (FLONASE) 50 MCG/ACT nasal spray Place 1 spray into both nostrils as needed.   Marland Kitchen lisinopril (ZESTRIL) 20 MG tablet Take 1 tablet by mouth once daily  . Lysine 500 MG TABS Take 500 mg by mouth daily.   . MULTIPLE VITAMIN PO Take 1 tablet by mouth daily.   . traMADol (ULTRAM) 50 MG tablet Take 50 mg  by mouth 2 (two) times daily as needed.  Marland Kitchen amoxicillin-clavulanate (AUGMENTIN) 875-125 MG tablet Take 1 tablet by mouth 2 (two) times daily. (Patient not taking: No sig reported)   No facility-administered medications prior to visit.    Review of Systems  Constitutional: Negative.   Respiratory: Negative.   Gastrointestinal: Negative for abdominal pain, constipation, diarrhea, nausea and vomiting.  Genitourinary: Positive for pelvic pain. Negative for dysuria, flank pain, frequency and urgency.  Musculoskeletal: Negative for back pain and joint pain.  Skin: Negative for rash.  Neurological: Negative for headaches.      Objective    BP (!) 148/80 (BP Location: Left Arm, Patient Position: Sitting, Cuff Size: Large)   Pulse 84   Temp (!) 97.5 F (36.4 C) (Oral)   Wt 164 lb 4.8 oz (74.5 kg)   SpO2 98%   BMI 27.34 kg/m    Physical Exam Constitutional:      Appearance: Normal appearance. She is normal weight.  Genitourinary:    Comments: Posterior prolapse visible at introitus.  Skin:    General: Skin is warm and dry.  Neurological:     General: No focal deficit present.     Mental Status: She is alert and oriented to person, place, and time.  Psychiatric:        Mood and Affect: Mood normal.        Behavior: Behavior normal.       No results found for any visits on 12/18/19.  Assessment & Plan  1. Female genital prolapse, unspecified type  Posterior prolapse. Counseled on range of treatments from pessaries to surgical intervention. Refer to urogyencology.   - Ambulatory referral to Urogynecology   Return if symptoms worsen or fail to improve.      ITrinna Post, PA-C, have reviewed all documentation for this visit. The documentation on 12/22/19 for the exam, diagnosis, procedures, and orders are all accurate and complete.  The entirety of the information documented in the History of Present Illness, Review of Systems and Physical Exam were personally  obtained by me. Portions of this information were initially documented by [facility] and reviewed by me for thoroughness and accuracy.   I spent 20 minutes dedicated to the care of this patient on the date of this encounter to include pre-visit review of records, face-to-face time with the patient discussing pelvic organ prolapse, and post visit ordering of testing.     Paulene Floor  [facility] [phone] (phone) [phone] (fax)  [place]

## 2019-12-23 DIAGNOSIS — M7061 Trochanteric bursitis, right hip: Secondary | ICD-10-CM | POA: Diagnosis not present

## 2019-12-23 NOTE — Progress Notes (Signed)
I,Kaitlyn Zuniga,acting as a scribe for Kaitlyn Durie, MD.,have documented all relevant documentation on the behalf of Kaitlyn Durie, MD,as directed by  Kaitlyn Durie, MD while in the presence of Kaitlyn Durie, MD.   Established patient visit   Patient: Kaitlyn Zuniga   DOB: 1932/04/03   86 y.o. Female  MRN: [id number] Visit Date: 12/24/2019  Today's healthcare provider: [name], MD   Chief Complaint  Patient presents with   Hypertension   Subjective    HPI  Patient feels well and is doing very well.  She has no complaints.  She is taking her medications.  She has had no falls. Hypertension, follow-up  BP Readings from Last 3 Encounters:  12/24/19 138/67  12/18/19 (!) 148/80  07/22/19 130/71   Wt Readings from Last 3 Encounters:  12/24/19 164 lb 12.8 oz (74.8 kg)  12/18/19 164 lb 4.8 oz (74.5 kg)  07/22/19 161 lb 9.6 oz (73.3 kg)     She was last seen for hypertension 5 months ago.  BP at that visit was 130/71. Management since that visit includes; Continue current medications. Follow up in Nov/Dec. She reports good compliance with treatment. She is not having side effects.  She is exercising. She is adherent to low salt diet.   Outside blood pressures are 622'W systolic. Patient is unsure of the diastolic reading.  She does not smoke.  Use of agents associated with hypertension: none.   ---------------------------------------------------------------------------------------------       Medications: Outpatient Medications Prior to Visit  Medication Sig   acetaminophen (TYLENOL) 500 MG tablet Take 500 mg by mouth every 6 (six) hours as needed for mild pain.   Calcium Carb-Cholecalciferol (CALCIUM PLUS VITAMIN D3) 600-500 MG-UNIT CAPS Take 1 tablet by mouth daily.   carboxymethylcellulose (REFRESH PLUS) 0.5 % SOLN Place 1 drop into both eyes daily.   fluticasone (FLONASE) 50 MCG/ACT nasal spray Place 1 spray into  both nostrils as needed.    lisinopril (ZESTRIL) 20 MG tablet Take 1 tablet by mouth once daily   Lysine 500 MG TABS Take 500 mg by mouth daily.    MULTIPLE VITAMIN PO Take 1 tablet by mouth daily.    traMADol (ULTRAM) 50 MG tablet Take 50 mg by mouth 2 (two) times daily as needed.   [DISCONTINUED] amoxicillin-clavulanate (AUGMENTIN) 875-125 MG tablet Take 1 tablet by mouth 2 (two) times daily. (Patient not taking: No sig reported)   No facility-administered medications prior to visit.    Review of Systems  Constitutional: Negative for appetite change, chills, fatigue and fever.  Respiratory: Negative for chest tightness and shortness of breath.   Cardiovascular: Negative for chest pain and palpitations.  Gastrointestinal: Negative for abdominal pain, nausea and vomiting.  Neurological: Negative for dizziness and weakness.       Objective    BP 138/67 (BP Location: Right Arm, Patient Position: Sitting, Cuff Size: Normal)    Pulse 72    Temp (!) 97.5 F (36.4 C) (Temporal)    Resp 16    Wt 164 lb 12.8 oz (74.8 kg)    BMI 27.42 kg/m  BP Readings from Last 3 Encounters:  12/24/19 138/67  12/18/19 (!) 148/80  07/22/19 130/71   Wt Readings from Last 3 Encounters:  12/24/19 164 lb 12.8 oz (74.8 kg)  12/18/19 164 lb 4.8 oz (74.5 kg)  07/22/19 161 lb 9.6 oz (73.3 kg)      Physical Exam Vitals reviewed.  Constitutional:      Appearance: She is well-developed.  HENT:     Head: Normocephalic and atraumatic.     Right Ear: External ear normal.     Left Ear: External ear normal.     Nose: Nose normal.  Eyes:     Conjunctiva/sclera: Conjunctivae normal.  Neck:     Thyroid: No thyromegaly.  Cardiovascular:     Rate and Rhythm: Normal rate and regular rhythm.     Heart sounds: Normal heart sounds.  Pulmonary:     Effort: Pulmonary effort is normal.     Breath sounds: Normal breath sounds.  Chest:  Breasts:     Right: Normal.     Left: Normal.    Abdominal:      Palpations: Abdomen is soft.  Musculoskeletal:     Cervical back: Neck supple.  Skin:    General: Skin is warm and dry.     Comments: Very fair skin.  Neurological:     Mental Status: She is alert and oriented to person, place, and time. Mental status is at baseline.  Psychiatric:        Mood and Affect: Mood normal.        Behavior: Behavior normal.        Thought Content: Thought content normal.        Judgment: Judgment normal.       No results found for any visits on 12/24/19.  Assessment & Plan     Problem List Items Addressed This Visit      Cardiovascular and Mediastinum   Essential (primary) hypertension - Primary    Well-controlled on lisinopril 20 mg daily.        Nervous and Auditory   Mild cognitive disorder    Not currently progressive.  MMSE at least once a year.        Musculoskeletal and Integument   Osteoarthritis of both knees       Return in about 7 months (around 07/23/2020) for CPE.         Kaitlyn Rochford Cranford Mon, MD  [facility] [phone] (phone) [phone] (fax)  [place]

## 2019-12-24 ENCOUNTER — Ambulatory Visit: Payer: Medicare Other | Admitting: Family Medicine

## 2019-12-24 ENCOUNTER — Other Ambulatory Visit: Payer: Self-pay

## 2019-12-24 ENCOUNTER — Encounter: Payer: Self-pay | Admitting: Family Medicine

## 2019-12-24 VITALS — BP 138/67 | HR 72 | Temp 97.5°F | Resp 16 | Wt 164.8 lb

## 2019-12-24 DIAGNOSIS — I10 Essential (primary) hypertension: Secondary | ICD-10-CM | POA: Diagnosis not present

## 2019-12-24 DIAGNOSIS — F09 Unspecified mental disorder due to known physiological condition: Secondary | ICD-10-CM | POA: Diagnosis not present

## 2019-12-24 DIAGNOSIS — M170 Bilateral primary osteoarthritis of knee: Secondary | ICD-10-CM | POA: Diagnosis not present

## 2019-12-28 NOTE — Assessment & Plan Note (Signed)
Well controlled on lisinopril 20 mg daily.

## 2019-12-28 NOTE — Assessment & Plan Note (Signed)
Not currently progressive.  MMSE at least once a year.

## 2020-01-11 ENCOUNTER — Other Ambulatory Visit: Payer: Self-pay

## 2020-01-11 ENCOUNTER — Ambulatory Visit: Payer: Medicare Other | Admitting: Urology

## 2020-01-11 VITALS — BP 160/74 | HR 70 | Ht 65.5 in | Wt 165.0 lb

## 2020-01-11 DIAGNOSIS — N8111 Cystocele, midline: Secondary | ICD-10-CM | POA: Diagnosis not present

## 2020-01-11 NOTE — Progress Notes (Signed)
01/11/2020 1:05 PM   Kaitlyn Zuniga 16-Jan-1932 [id number]  Referring provider: Trinna Post, PA-C [address] [place],  [place] [address]  No chief complaint on file.   HPI: Patient has felt vaginal bulging for more than 1 month.  Sometimes she reduces it.  She is continent.  She voids 4 times at night but drinks a lot of fluid.  No ankle edema or diuretic.  Voids about every 1 hour with a good flow.  Rarely does not feel empty  Has not had a hysterectomy.  Has had lumbar spine surgery.  Bowel movements normal.    No history of bladder surgery kidney stones or bladder infections   PMH: Past Medical History:  Diagnosis Date  . Arthritis   . Hypertension     Surgical History: Past Surgical History:  Procedure Laterality Date  . BREAST BIOPSY Right 2006   benign  . BREAST SURGERY Right [id number]   Biopsy  . HEMI-MICRODISCECTOMY LUMBAR LAMINECTOMY LEVEL 2 N/A 11/10/2018   Procedure: LEFT L3-4, L4-5 HEMILAMINECTOMY & DISCECTOMY;  Surgeon: Deetta Perla, MD;  Location: [facility] ORS;  Service: Neurosurgery;  Laterality: N/A;    Home Medications:  Allergies as of 01/11/2020   No Known Allergies     Medication List       Accurate as of January 11, 2020  1:05 PM. If you have any questions, ask your nurse or doctor.        acetaminophen 500 MG tablet Commonly known as: TYLENOL Take 500 mg by mouth every 6 (six) hours as needed for mild pain.   atorvastatin 20 MG tablet Commonly known as: LIPITOR Take by mouth.   Calcium Plus Vitamin D3 600-500 MG-UNIT Caps Generic drug: Calcium Carb-Cholecalciferol Take 1 tablet by mouth daily.   carboxymethylcellulose 0.5 % Soln Commonly known as: REFRESH PLUS Place 1 drop into both eyes daily.   fluticasone 50 MCG/ACT nasal spray Commonly known as: FLONASE Place 1 spray into both nostrils as needed.   lisinopril 20 MG tablet Commonly known as: ZESTRIL Take 1 tablet by mouth once daily   Lysine 500 MG  Tabs Take 500 mg by mouth daily.   MULTIPLE VITAMIN PO Take 1 tablet by mouth daily.   traMADol 50 MG tablet Commonly known as: ULTRAM Take 50 mg by mouth 2 (two) times daily as needed.       Allergies: No Known Allergies  Family History: Family History  Problem Relation Age of Onset  . Cancer Mother        lung  . Cancer Father        prostate  . Hypertension Father   . Breast cancer Sister 64    Social History:  reports that she has quit smoking. She quit after 10.00 years of use. She has never used smokeless tobacco. She reports that she does not drink alcohol and does not use drugs.  ROS:                                        Physical Exam: There were no vitals taken for this visit.  Constitutional:  Alert and oriented, No acute distress. HEENT: [place] AT, moist mucus membranes.  Trachea midline, no masses. Cardiovascular: No clubbing, cyanosis, or edema. Respiratory: Normal respiratory effort, no increased work of breathing. GI: Abdomen is soft, nontender, nondistended, no abdominal masses GU: On pelvic examination the findings  were interesting.  With the posterior wall and cervix reduced she had a moderate grade 2 cystocele with moderate central defect and mild shortening of the anterior vaginal wall.  When supporting the cystocele her cervix descended from 8 or 9 cm to approximately 5 cm and she had a very impressive large posterior defect with part of the vaginal epithelium quite smooth.  There was mild friability and dryness Skin: No rashes, bruises or suspicious lesions. Lymph: No cervical or inguinal adenopathy. Neurologic: Grossly intact, no focal deficits, moving all 4 extremities. Psychiatric: Normal mood and affect.  Laboratory Data: Lab Results  Component Value Date   WBC 4.2 07/22/2019   HGB 12.5 07/22/2019   HCT 37.1 07/22/2019   MCV 91 07/22/2019   PLT 181 07/22/2019    Lab Results  Component Value Date   CREATININE 0.94  07/22/2019    No results found for: PSA  No results found for: TESTOSTERONE  No results found for: HGBA1C  Urinalysis    Component Value Date/Time   COLORURINE YELLOW (A) 11/06/2018 1047   APPEARANCEUR CLEAR (A) 11/06/2018 1047   LABSPEC 1.011 11/06/2018 1047   PHURINE 6.0 11/06/2018 1047   GLUCOSEU NEGATIVE 11/06/2018 1047   HGBUR NEGATIVE 11/06/2018 1047   BILIRUBINUR NEGATIVE 11/06/2018 1047   BILIRUBINUR negative 07/03/2017 0926   KETONESUR NEGATIVE 11/06/2018 1047   PROTEINUR NEGATIVE 11/06/2018 1047   UROBILINOGEN 0.2 07/03/2017 0926   NITRITE NEGATIVE 11/06/2018 1047   LEUKOCYTESUR NEGATIVE 11/06/2018 1047    Pertinent Imaging:   Assessment & Plan: Patient has recent onset of prolapse symptoms.  She has nocturia.  Sometimes she reduces it.  Picture was drawn.  She does have uterine descensus but also an impressive posterior defect.  She might have an enterocele but this is less likely with the uterus present.  If she has a surgery I think she would best benefit from a robotic hysterectomy and vault suspension.  This would address any enterocele but also address both the anterior and posterior defects.  I do not think she would need a rectocele repair distally.  Role of watchful waiting and natural history and pessary discussed.  Role of urodynamics.  Activity discussed.  I will see her as needed.  I was not surprised by her treatment choice  There are no diagnoses linked to this encounter.  No follow-ups on file.  Martina Sinner, MD  [facility] [address] [place], [place] [address] [phone]

## 2020-01-18 DIAGNOSIS — H401121 Primary open-angle glaucoma, left eye, mild stage: Secondary | ICD-10-CM | POA: Diagnosis not present

## 2020-03-18 ENCOUNTER — Other Ambulatory Visit: Payer: Self-pay | Admitting: Family Medicine

## 2020-03-18 DIAGNOSIS — I10 Essential (primary) hypertension: Secondary | ICD-10-CM

## 2020-04-07 DIAGNOSIS — M25561 Pain in right knee: Secondary | ICD-10-CM | POA: Diagnosis not present

## 2020-04-07 DIAGNOSIS — G8929 Other chronic pain: Secondary | ICD-10-CM | POA: Diagnosis not present

## 2020-04-07 DIAGNOSIS — M5416 Radiculopathy, lumbar region: Secondary | ICD-10-CM | POA: Diagnosis not present

## 2020-04-08 ENCOUNTER — Other Ambulatory Visit: Payer: Self-pay | Admitting: Rheumatology

## 2020-04-08 DIAGNOSIS — M5416 Radiculopathy, lumbar region: Secondary | ICD-10-CM

## 2020-04-21 ENCOUNTER — Ambulatory Visit: Payer: Medicare Other

## 2020-05-26 DIAGNOSIS — D485 Neoplasm of uncertain behavior of skin: Secondary | ICD-10-CM | POA: Diagnosis not present

## 2020-05-26 DIAGNOSIS — L820 Inflamed seborrheic keratosis: Secondary | ICD-10-CM | POA: Diagnosis not present

## 2020-05-26 DIAGNOSIS — L538 Other specified erythematous conditions: Secondary | ICD-10-CM | POA: Diagnosis not present

## 2020-05-26 DIAGNOSIS — R208 Other disturbances of skin sensation: Secondary | ICD-10-CM | POA: Diagnosis not present

## 2020-06-08 DIAGNOSIS — D0462 Carcinoma in situ of skin of left upper limb, including shoulder: Secondary | ICD-10-CM | POA: Diagnosis not present

## 2020-06-08 DIAGNOSIS — L570 Actinic keratosis: Secondary | ICD-10-CM | POA: Diagnosis not present

## 2020-06-17 ENCOUNTER — Other Ambulatory Visit: Payer: Self-pay | Admitting: Family Medicine

## 2020-06-17 DIAGNOSIS — I10 Essential (primary) hypertension: Secondary | ICD-10-CM

## 2020-06-21 DIAGNOSIS — L03011 Cellulitis of right finger: Secondary | ICD-10-CM | POA: Diagnosis not present

## 2020-06-21 DIAGNOSIS — Z23 Encounter for immunization: Secondary | ICD-10-CM | POA: Diagnosis not present

## 2020-07-18 DIAGNOSIS — H40003 Preglaucoma, unspecified, bilateral: Secondary | ICD-10-CM | POA: Diagnosis not present

## 2020-07-26 DIAGNOSIS — H40001 Preglaucoma, unspecified, right eye: Secondary | ICD-10-CM | POA: Diagnosis not present

## 2020-07-26 DIAGNOSIS — H401121 Primary open-angle glaucoma, left eye, mild stage: Secondary | ICD-10-CM | POA: Diagnosis not present

## 2020-08-04 ENCOUNTER — Ambulatory Visit (INDEPENDENT_AMBULATORY_CARE_PROVIDER_SITE_OTHER): Payer: Medicare Other | Admitting: Family Medicine

## 2020-08-04 ENCOUNTER — Other Ambulatory Visit: Payer: Self-pay

## 2020-08-04 ENCOUNTER — Encounter: Payer: Self-pay | Admitting: Family Medicine

## 2020-08-04 VITALS — BP 122/75 | HR 71 | Temp 97.3°F | Resp 16 | Ht 66.0 in | Wt 163.0 lb

## 2020-08-04 DIAGNOSIS — I10 Essential (primary) hypertension: Secondary | ICD-10-CM

## 2020-08-04 DIAGNOSIS — F09 Unspecified mental disorder due to known physiological condition: Secondary | ICD-10-CM

## 2020-08-04 DIAGNOSIS — E7800 Pure hypercholesterolemia, unspecified: Secondary | ICD-10-CM

## 2020-08-04 DIAGNOSIS — Z0000 Encounter for general adult medical examination without abnormal findings: Secondary | ICD-10-CM | POA: Diagnosis not present

## 2020-08-04 DIAGNOSIS — G4700 Insomnia, unspecified: Secondary | ICD-10-CM

## 2020-08-04 MED ORDER — TRAZODONE HCL 50 MG PO TABS: 50.0000 mg | ORAL_TABLET | Freq: Every day | ORAL | 3 refills | Status: DC

## 2020-08-04 MED ORDER — LISINOPRIL 10 MG PO TABS: 10.0000 mg | ORAL_TABLET | Freq: Every day | ORAL | 1 refills | Status: DC

## 2020-08-04 NOTE — Patient Instructions (Signed)
Get Covid Vaccine.

## 2020-08-04 NOTE — Progress Notes (Signed)
I,April Miller,acting as a scribe for [name], MD.,have documented all relevant documentation on the behalf of [name], MD,as directed by  [name], MD while in the presence of [name], MD.  Annual Wellness Visit     Patient: Kaitlyn Zuniga, Female    DOB: November 26, 1932, 87 y.o.   MRN: [id number] Visit Date: 08/04/2020  Today's Provider: [name], MD   Chief Complaint  Patient presents with   Medicare Wellness   Subjective    Kaitlyn Zuniga is a 87 y.o. female who presents today for her Annual Wellness Visit.CPE She reports consuming a general diet. Home exercise routine includes stretching and walking. She generally feels fairly well. She reports sleeping poorly. She does not have additional problems to discuss today.  She does have chronic insomnia and complains of occasional dizziness with standing. She has been married for 54 years and has 2 children and 4 grandchildren. She has had 3 COVID shots. HPI       Medications: Outpatient Medications Prior to Visit  Medication Sig   acetaminophen (TYLENOL) 500 MG tablet Take 500 mg by mouth every 6 (six) hours as needed for mild pain.   atorvastatin (LIPITOR) 20 MG tablet Take by mouth.   Calcium Carb-Cholecalciferol (CALCIUM PLUS VITAMIN D3) 600-500 MG-UNIT CAPS Take 1 tablet by mouth daily.   carboxymethylcellulose (REFRESH PLUS) 0.5 % SOLN Place 1 drop into both eyes daily.   Lysine 500 MG TABS Take 500 mg by mouth daily.    MULTIPLE VITAMIN PO Take 1 tablet by mouth daily.    traMADol (ULTRAM) 50 MG tablet Take 50 mg by mouth 2 (two) times daily as needed.   [DISCONTINUED] lisinopril (ZESTRIL) 20 MG tablet Take 1 tablet by mouth once daily   No facility-administered medications prior to visit.    No Known Allergies  Patient Care Team: Jerrol Banana., MD as PCP - General (Family Medicine) Dasher, Rayvon Char, MD (Dermatology) Clyde Canterbury, MD as Referring  Physician (Otolaryngology) Kipp Laurence, MD as Referring Physician (Audiology) Leandrew Koyanagi, MD as Referring Physician (Ophthalmology) Marlowe Sax, MD as Referring Physician (Internal Medicine)  Review of Systems  HENT:  Positive for hearing loss and sinus pressure.   Musculoskeletal:  Positive for back pain.  Neurological:  Positive for dizziness.  Hematological:  Bruises/bleeds easily.  Psychiatric/Behavioral:  Positive for sleep disturbance.   All other systems reviewed and are negative.       Objective    Vitals: BP 122/75 (BP Location: Right Arm, Patient Position: Sitting, Cuff Size: Normal)   Pulse 71   Temp (!) 97.3 F (36.3 C) (Temporal)   Resp 16   Ht '5\' 6"'$  (1.676 m)   Wt 163 lb (73.9 kg)   SpO2 97%   BMI 26.31 kg/m  BP Readings from Last 3 Encounters:  08/04/20 122/75  01/11/20 (!) 160/74  12/24/19 138/67   Wt Readings from Last 3 Encounters:  08/04/20 163 lb (73.9 kg)  01/11/20 165 lb (74.8 kg)  12/24/19 164 lb 12.8 oz (74.8 kg)      Physical Exam Vitals reviewed.  Constitutional:      Appearance: Normal appearance.  HENT:     Head: Normocephalic and atraumatic.     Right Ear: Tympanic membrane normal.     Left Ear: Tympanic membrane normal.     Mouth/Throat:     Mouth: Mucous membranes are moist.     Pharynx: Oropharynx is  clear.  Eyes:     General: No scleral icterus.    Conjunctiva/sclera: Conjunctivae normal.  Neck:     Vascular: No carotid bruit.  Cardiovascular:     Rate and Rhythm: Normal rate and regular rhythm.     Pulses: Normal pulses.     Heart sounds: Normal heart sounds.  Pulmonary:     Effort: Pulmonary effort is normal.     Breath sounds: Normal breath sounds.  Abdominal:     General: Bowel sounds are normal.     Palpations: Abdomen is soft.  Musculoskeletal:     Cervical back: Normal range of motion and neck supple.     Right lower leg: No edema.     Left lower leg: No edema.  Skin:    General:  Skin is warm and dry.     Comments: Very fair skin.  Neurological:     General: No focal deficit present.     Mental Status: She is alert and oriented to person, place, and time.  Psychiatric:        Mood and Affect: Mood normal.        Behavior: Behavior normal.        Thought Content: Thought content normal.        Judgment: Judgment normal.     Most recent functional status assessment: In your present state of health, do you have any difficulty performing the following activities: 08/04/2020  Hearing? Y  Vision? N  Difficulty concentrating or making decisions? Y  Walking or climbing stairs? N  Dressing or bathing? N  Doing errands, shopping? N  Some recent data might be hidden   Most recent fall risk assessment: Fall Risk  08/04/2020  Falls in the past year? 0  Number falls in past yr: 0  Injury with Fall? 0  Risk for fall due to : Mental status change  Follow up Falls evaluation completed    Most recent depression screenings: PHQ 2/9 Scores 08/04/2020 12/18/2019  PHQ - 2 Score 0 0  PHQ- 9 Score 2 0   Most recent cognitive screening: No flowsheet data found. Most recent Audit-C alcohol use screening Alcohol Use Disorder Test (AUDIT) 08/04/2020  1. How often do you have a drink containing alcohol? 0  2. How many drinks containing alcohol do you have on a typical day when you are drinking? 0  3. How often do you have six or more drinks on one occasion? 0  AUDIT-C Score 0  Alcohol Brief Interventions/Follow-up -   MMSE - Mini Mental State Exam 08/04/2020 07/22/2019 07/03/2017  Orientation to time '5 3 5  '$ Orientation to Place '5 5 5  '$ Registration '3 3 3  '$ Attention/ Calculation '5 3 5  '$ Recall '3 3 2  '$ Language- name 2 objects '2 2 2  '$ Language- repeat '1 1 1  '$ Language- follow 3 step command '3 2 3  '$ Language- read & follow direction '1 1 1  '$ Write a sentence '1 1 1  '$ Copy design '1 1 1  '$ Total score '30 25 29    '$ A score of 3 or more in women, and 4 or more in men indicates increased  risk for alcohol abuse, EXCEPT if all of the points are from question 1   No results found for any visits on 08/04/20.  Assessment & Plan     Annual wellness visit done today including the all of the following: Reviewed patient's Family Medical History Reviewed and updated list of patient's medical providers Assessment  of cognitive impairment was done Assessed patient's functional ability Established a written schedule for health screening [place] Completed and Reviewed  Exercise Activities and Dietary recommendations  Goals      DIET - INCREASE WATER INTAKE     Recommend to drink at least 6-8 8oz glasses of water per day.        Immunization History  Administered Date(s) Administered   Influenza, High Dose Seasonal PF 09/28/2014, 09/29/2015, 10/01/2016, 12/26/2017, 10/26/2019   Influenza-Unspecified 09/26/2018   PFIZER(Purple Top)SARS-COV-2 Vaccination 02/09/2019, 03/02/2019, 10/07/2019   Pneumococcal Conjugate-13 09/24/2013   Pneumococcal Polysaccharide-23 11/16/1999   Td 06/06/2004   Tdap 10/18/2010   Zoster Recombinat (Shingrix) 06/12/2019   Zoster, Live 01/10/2006    Health Maintenance  Topic Date Due   Zoster Vaccines- Shingrix (2 of 2) 08/07/2019   COVID-19 Vaccine (4 - Booster for Pfizer series) 01/06/2020   INFLUENZA VACCINE  08/08/2020   DEXA SCAN  11/07/2021   TETANUS/TDAP  06/22/2030   PNA vac Low Risk Adult  Completed   HPV VACCINES  Aged Out     Discussed health benefits of physical activity, and encouraged her to engage in regular exercise appropriate for her age and condition.   1. Encounter for Medicare annual wellness exam  - Lipid panel - TSH - CBC w/Diff/Platelet - Comprehensive Metabolic Panel (CMET)  2. Annual physical exam   3. Essential (primary) hypertension  - Lipid panel - TSH - CBC w/Diff/Platelet - Comprehensive Metabolic Panel (CMET) - lisinopril (ZESTRIL) 10 MG tablet; Take 1 tablet (10 mg total)  by mouth daily.  Dispense: 90 tablet; Refill: 1  4. Hypercholesterolemia without hypertriglyceridemia  - Lipid panel - TSH - CBC w/Diff/Platelet - Comprehensive Metabolic Panel (CMET)  5. Mild cognitive disorder MMSE 30/30 today - Lipid panel - TSH - CBC w/Diff/Platelet - Comprehensive Metabolic Panel (CMET)  6. Insomnia, unspecified type Add trazodone for safety.  Avoid benzodiazepines. - traZODone (DESYREL) 50 MG tablet; Take 1 tablet (50 mg total) by mouth at bedtime.  Dispense: 30 tablet; Refill: 3    Return in about 3 months (around 11/04/2020).     I, [name], MD, have reviewed all documentation for this visit. The documentation on 08/20/20 for the exam, diagnosis, procedures, and orders are all accurate and complete.    Shevonne Wolf Cranford Mon, MD  [facility] [phone] (phone) [phone] (fax)  [place]

## 2020-08-10 LAB — COMPREHENSIVE METABOLIC PANEL
ALT: 7 IU/L (ref 0–32)
AST: 22 IU/L (ref 0–40)
Albumin/Globulin Ratio: 1.9 (ref 1.2–2.2)
Albumin: 4.6 g/dL (ref 3.6–4.6)
Alkaline Phosphatase: 59 IU/L (ref 44–121)
BUN/Creatinine Ratio: 17 (ref 12–28)
BUN: 14 mg/dL (ref 8–27)
Bilirubin Total: 0.6 mg/dL (ref 0.0–1.2)
CO2: 24 mmol/L (ref 20–29)
Calcium: 9.4 mg/dL (ref 8.7–10.3)
Chloride: 100 mmol/L (ref 96–106)
Creatinine, Ser: 0.84 mg/dL (ref 0.57–1.00)
Globulin, Total: 2.4 g/dL (ref 1.5–4.5)
Glucose: 95 mg/dL (ref 65–99)
Potassium: 4.6 mmol/L (ref 3.5–5.2)
Sodium: 138 mmol/L (ref 134–144)
Total Protein: 7 g/dL (ref 6.0–8.5)
eGFR: 68 mL/min/{1.73_m2} (ref >59)

## 2020-08-10 LAB — CBC WITH DIFFERENTIAL/PLATELET
Basophils Absolute: 0 10*3/uL (ref 0.0–0.2)
Basos: 0 %
EOS (ABSOLUTE): 0 10*3/uL (ref 0.0–0.4)
Eos: 0 %
Hematocrit: 39.8 % (ref 34.0–46.6)
Hemoglobin: 12.7 g/dL (ref 11.1–15.9)
Immature Grans (Abs): 0.1 10*3/uL (ref 0.0–0.1)
Immature Granulocytes: 1 %
Lymphocytes Absolute: 1.9 10*3/uL (ref 0.7–3.1)
Lymphs: 37 %
MCH: 29.9 pg (ref 26.6–33.0)
MCHC: 31.9 g/dL (ref 31.5–35.7)
MCV: 94 fL (ref 79–97)
Monocytes Absolute: 0.8 10*3/uL (ref 0.1–0.9)
Monocytes: 16 %
Neutrophils Absolute: 2.3 10*3/uL (ref 1.4–7.0)
Neutrophils: 46 %
Platelets: 184 10*3/uL (ref 150–450)
RBC: 4.25 x10E6/uL (ref 3.77–5.28)
RDW: 12.5 % (ref 11.7–15.4)
WBC: 5.1 10*3/uL (ref 3.4–10.8)

## 2020-08-10 LAB — LIPID PANEL
Chol/HDL Ratio: 4.1 ratio (ref 0.0–4.4)
Cholesterol, Total: 178 mg/dL (ref 100–199)
HDL: 43 mg/dL (ref >39)
LDL Chol Calc (NIH): 111 mg/dL — ABNORMAL HIGH (ref 0–99)
Triglycerides: 133 mg/dL (ref 0–149)
VLDL Cholesterol Cal: 24 mg/dL (ref 5–40)

## 2020-08-10 LAB — TSH: TSH: 1.7 u[IU]/mL (ref 0.450–4.500)

## 2020-08-17 ENCOUNTER — Telehealth: Payer: Self-pay | Admitting: *Deleted

## 2020-08-17 NOTE — Telephone Encounter (Signed)
Reviewed lab results and physician's note with the patient.

## 2020-08-25 DIAGNOSIS — Z03818 Encounter for observation for suspected exposure to other biological agents ruled out: Secondary | ICD-10-CM | POA: Diagnosis not present

## 2020-08-25 DIAGNOSIS — H9201 Otalgia, right ear: Secondary | ICD-10-CM | POA: Diagnosis not present

## 2020-08-25 DIAGNOSIS — Z20822 Contact with and (suspected) exposure to covid-19: Secondary | ICD-10-CM | POA: Diagnosis not present

## 2020-08-25 DIAGNOSIS — H60501 Unspecified acute noninfective otitis externa, right ear: Secondary | ICD-10-CM | POA: Diagnosis not present

## 2020-08-25 DIAGNOSIS — J019 Acute sinusitis, unspecified: Secondary | ICD-10-CM | POA: Diagnosis not present

## 2020-09-01 ENCOUNTER — Other Ambulatory Visit: Payer: Self-pay | Admitting: Family Medicine

## 2020-09-01 DIAGNOSIS — Z1231 Encounter for screening mammogram for malignant neoplasm of breast: Secondary | ICD-10-CM

## 2020-09-15 DIAGNOSIS — H02831 Dermatochalasis of right upper eyelid: Secondary | ICD-10-CM | POA: Diagnosis not present

## 2020-10-01 DIAGNOSIS — L03119 Cellulitis of unspecified part of limb: Secondary | ICD-10-CM | POA: Diagnosis not present

## 2020-10-17 DIAGNOSIS — D2271 Melanocytic nevi of right lower limb, including hip: Secondary | ICD-10-CM | POA: Diagnosis not present

## 2020-10-17 DIAGNOSIS — D2262 Melanocytic nevi of left upper limb, including shoulder: Secondary | ICD-10-CM | POA: Diagnosis not present

## 2020-10-17 DIAGNOSIS — Z85828 Personal history of other malignant neoplasm of skin: Secondary | ICD-10-CM | POA: Diagnosis not present

## 2020-10-17 DIAGNOSIS — D2261 Melanocytic nevi of right upper limb, including shoulder: Secondary | ICD-10-CM | POA: Diagnosis not present

## 2020-10-31 IMAGING — MG DIGITAL SCREENING BILAT W/ TOMO W/ CAD
8 series · 8 of 24 positions shown · non-contrast
Comparison: Previous exam(s).

CLINICAL DATA: Screening.

EXAM:
DIGITAL SCREENING BILATERAL MAMMOGRAM WITH TOMO AND CAD

[R CC synth-2D]
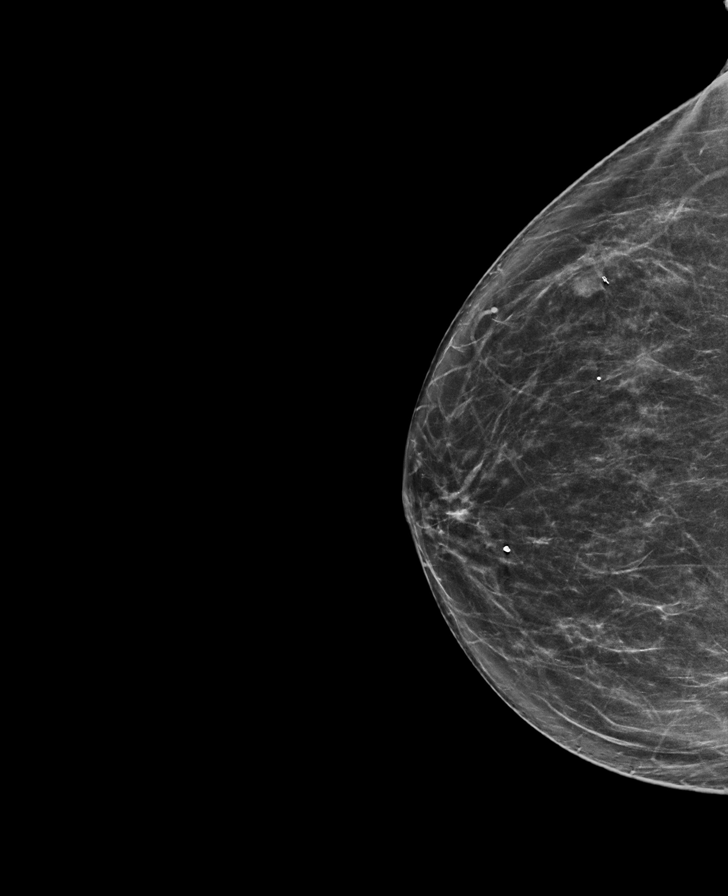

[R MLO synth-2D]
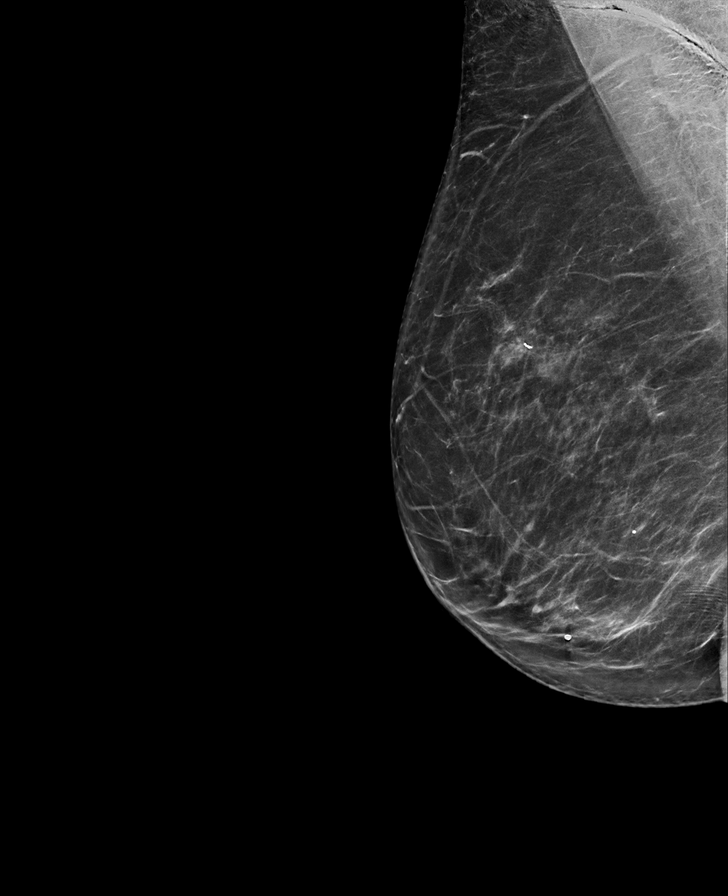

[L MLO synth-2D]
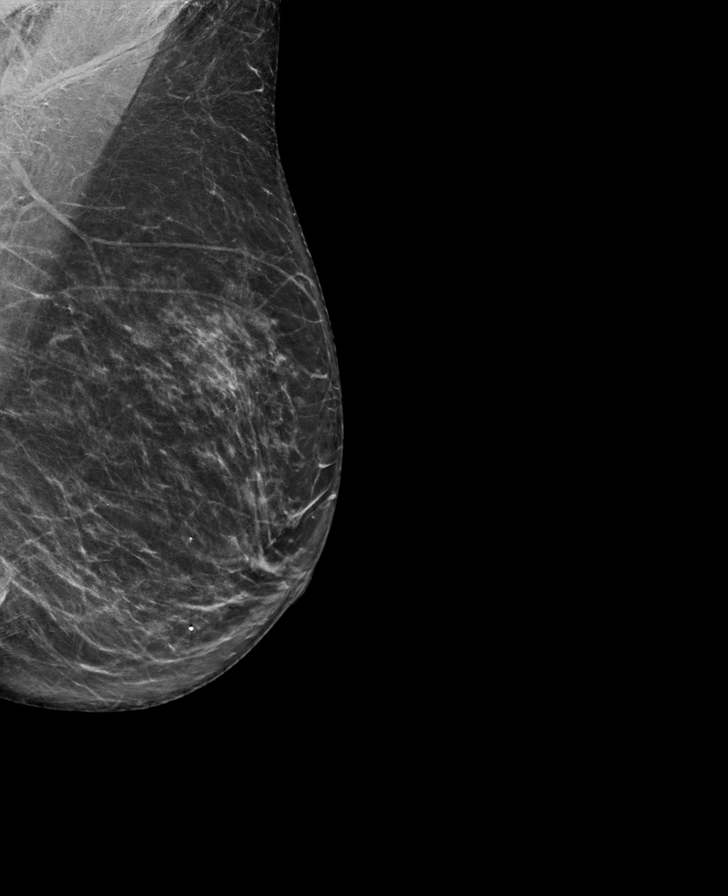

[L CC synth-2D]
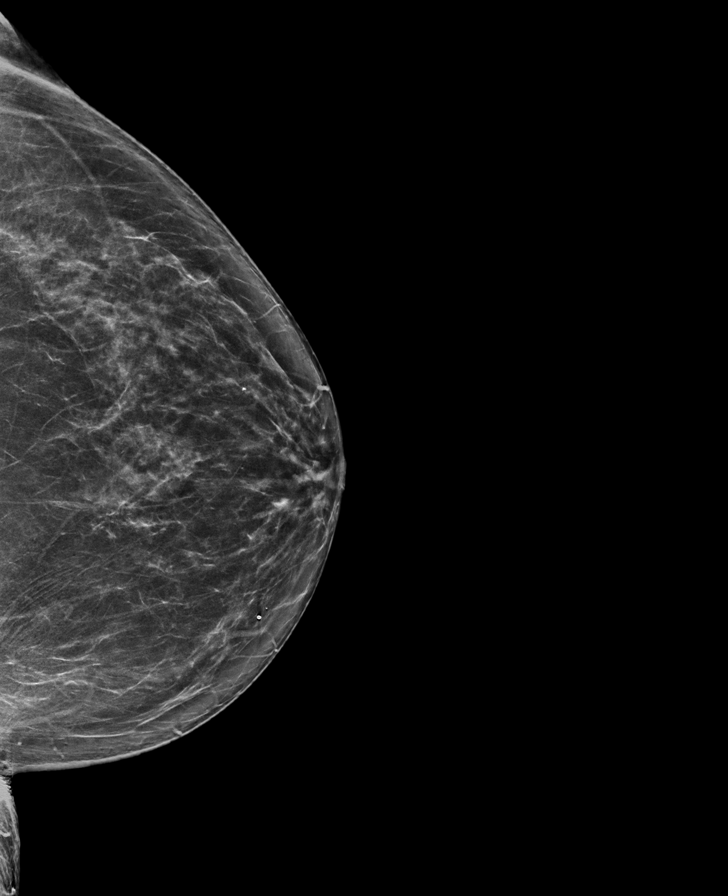

[L MLO tomo · tomo slice 43/86.0]
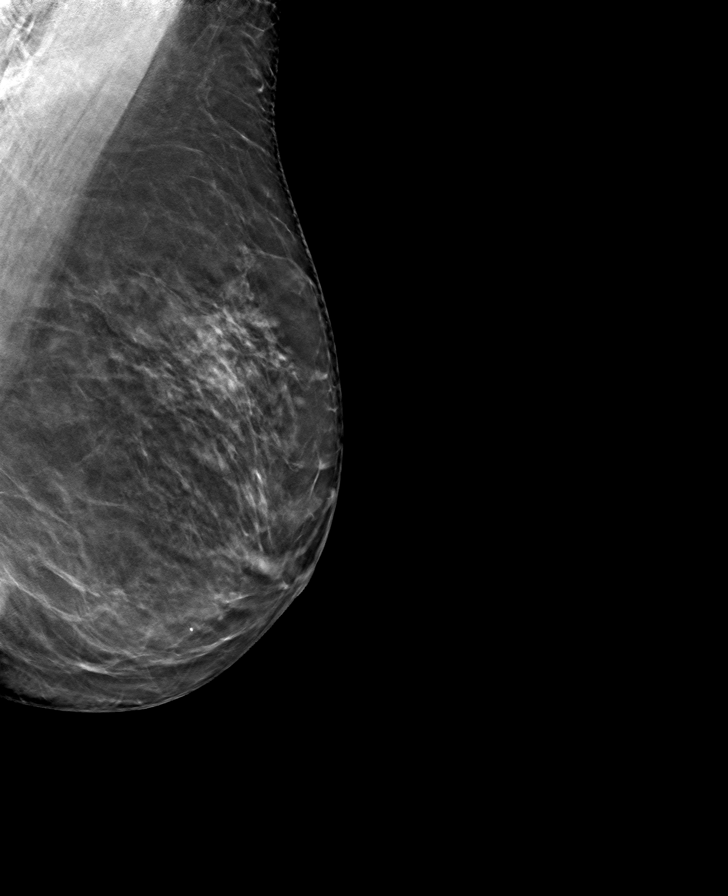

[R CC tomo · tomo slice 39/76.0]
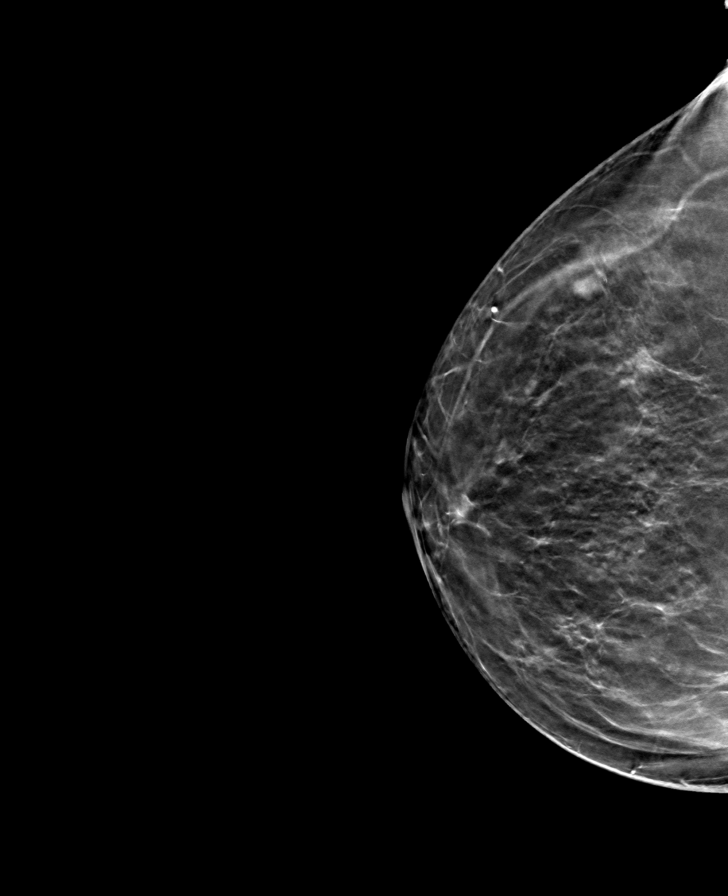

[R MLO tomo · tomo slice 45/89.0]
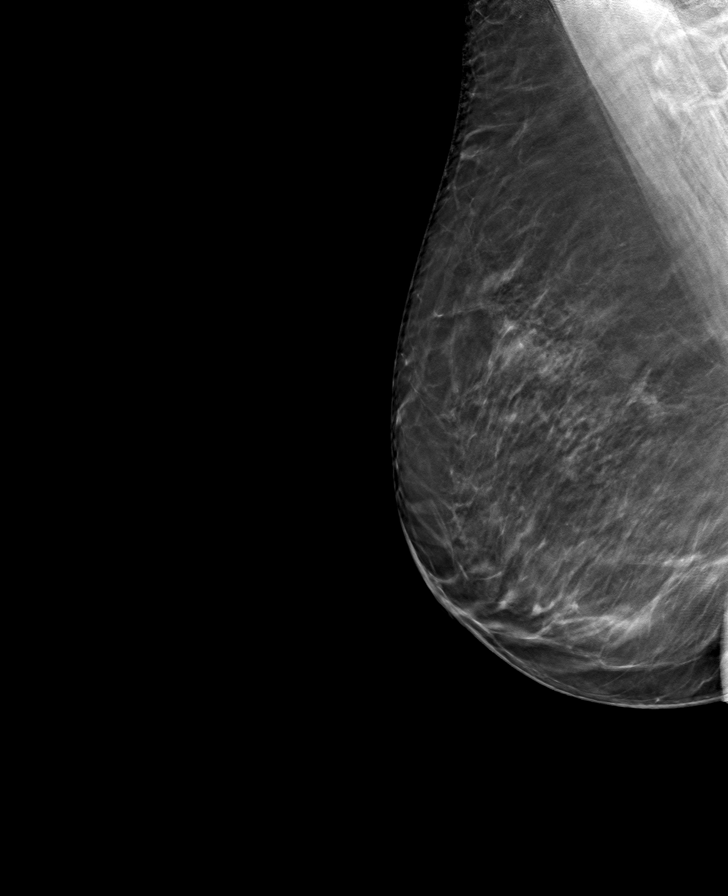

[L CC tomo · tomo slice 38/75.0]
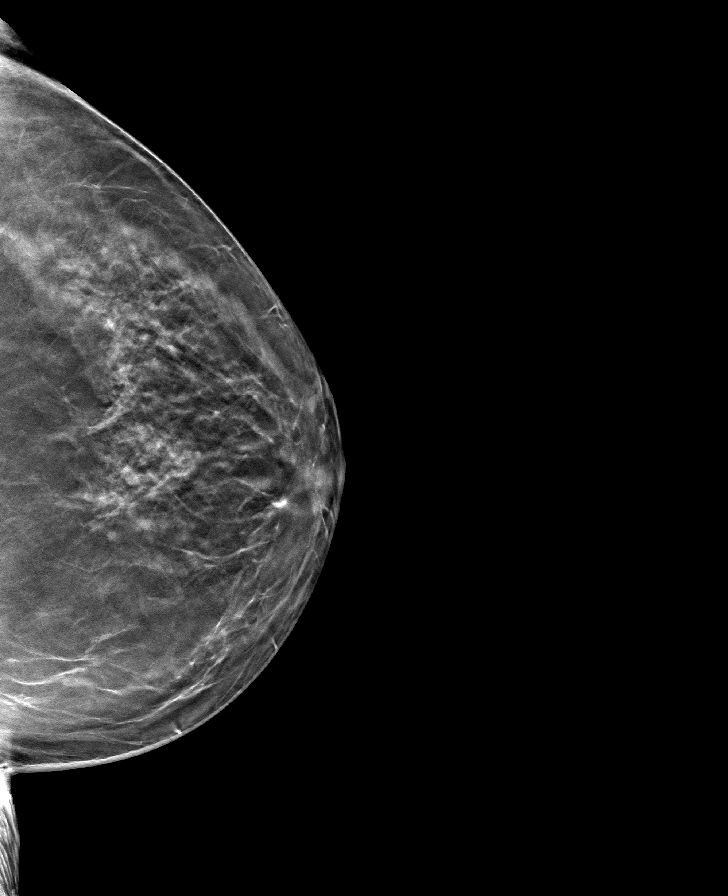

[8 of 24 positions shown; findings below may reference images not displayed]

ACR Breast Density Category b: There are scattered areas of
fibroglandular density.
FINDINGS: There are no findings suspicious for malignancy. Images were
processed with CAD.
IMPRESSION: No mammographic evidence of malignancy. A result letter of this
screening mammogram will be mailed directly to the patient.

RECOMMENDATION:
Screening mammogram in one year. (Code:[id number]-775)

BI-RADS CATEGORY  1: Negative.

## 2020-11-04 ENCOUNTER — Other Ambulatory Visit: Payer: Self-pay

## 2020-11-04 ENCOUNTER — Ambulatory Visit
Admission: RE | Admit: 2020-11-04 | Discharge: 2020-11-04 | Disposition: A | Discharge status: Home / Self Care | Payer: Medicare Other | Source: Ambulatory Visit | Attending: Family Medicine | Admitting: Family Medicine

## 2020-11-04 DIAGNOSIS — Z1231 Encounter for screening mammogram for malignant neoplasm of breast: Secondary | ICD-10-CM | POA: Diagnosis not present

## 2020-11-07 ENCOUNTER — Other Ambulatory Visit: Payer: Self-pay

## 2020-11-07 ENCOUNTER — Ambulatory Visit: Payer: Medicare Other | Admitting: Family Medicine

## 2020-11-07 VITALS — BP 138/74 | HR 81 | Temp 98.4°F | Wt 162.0 lb

## 2020-11-07 DIAGNOSIS — E7800 Pure hypercholesterolemia, unspecified: Secondary | ICD-10-CM | POA: Diagnosis not present

## 2020-11-07 DIAGNOSIS — G5602 Carpal tunnel syndrome, left upper limb: Secondary | ICD-10-CM | POA: Diagnosis not present

## 2020-11-07 DIAGNOSIS — I10 Essential (primary) hypertension: Secondary | ICD-10-CM

## 2020-11-07 DIAGNOSIS — Z23 Encounter for immunization: Secondary | ICD-10-CM | POA: Diagnosis not present

## 2020-11-07 DIAGNOSIS — M170 Bilateral primary osteoarthritis of knee: Secondary | ICD-10-CM

## 2020-11-07 DIAGNOSIS — F09 Unspecified mental disorder due to known physiological condition: Secondary | ICD-10-CM

## 2020-11-07 MED ORDER — PREDNISONE 20 MG PO TABS: 20.0000 mg | ORAL_TABLET | Freq: Every day | ORAL | 1 refills | Status: DC

## 2020-11-07 NOTE — Progress Notes (Signed)
Established patient visit   Patient: Kaitlyn Zuniga   DOB: 01/11/1932   87 y.o. Female  MRN: [id number] Visit Date: 11/07/2020  Today's healthcare provider: [name], MD   No chief complaint on file.  Subjective    HPI  Patient has some complaint of tingling in her fingers on the left hand and some discomfort.  Arm discomfort no chest pain no shortness of breath no cardiovascular sounding issues. He continues to live independently and feels well overall. Hypertension, follow-up  BP Readings from Last 3 Encounters:  11/07/20 138/74  08/04/20 122/75  01/11/20 (!) 160/74   Wt Readings from Last 3 Encounters:  11/07/20 162 lb (73.5 kg)  08/04/20 163 lb (73.9 kg)  01/11/20 165 lb (74.8 kg)     She was last seen for hypertension 3 months ago.  BP at that visit was as above. Management since that visit includes none.  She reports good compliance with treatment. She is not having side effects.  She is following a Regular diet.   Use of agents associated with hypertension: none.   Outside blood pressures are 120's/70's. Symptoms: No chest pain No chest pressure  No palpitations No syncope  No dyspnea No orthopnea  No paroxysmal nocturnal dyspnea No lower extremity edema   Pertinent labs: Lab Results  Component Value Date   CHOL 178 08/04/2020   HDL 43 08/04/2020   LDLCALC 111 (H) 08/04/2020   TRIG 133 08/04/2020   CHOLHDL 4.1 08/04/2020   Lab Results  Component Value Date   NA 138 08/04/2020   K 4.6 08/04/2020   CREATININE 0.84 08/04/2020   EGFR 68 08/04/2020   GLUCOSE 95 08/04/2020   TSH 1.700 08/04/2020     The ASCVD Risk score (Arnett DK, et al., 2019) failed to calculate for the following reasons:   The 2019 ASCVD risk score is only valid for ages 12 to 52   ---------------------------------------------------------------------------------------------------     Medications: Outpatient Medications Prior to Visit  Medication Sig    acetaminophen (TYLENOL) 500 MG tablet Take 500 mg by mouth every 6 (six) hours as needed for mild pain.   atorvastatin (LIPITOR) 20 MG tablet Take by mouth.   Calcium Carb-Cholecalciferol (CALCIUM PLUS VITAMIN D3) 600-500 MG-UNIT CAPS Take 1 tablet by mouth daily.   carboxymethylcellulose (REFRESH PLUS) 0.5 % SOLN Place 1 drop into both eyes daily.   lisinopril (ZESTRIL) 10 MG tablet Take 1 tablet (10 mg total) by mouth daily.   Lysine 500 MG TABS Take 500 mg by mouth daily.    MULTIPLE VITAMIN PO Take 1 tablet by mouth daily.    traMADol (ULTRAM) 50 MG tablet Take 50 mg by mouth 2 (two) times daily as needed.   traZODone (DESYREL) 50 MG tablet Take 1 tablet (50 mg total) by mouth at bedtime.   No facility-administered medications prior to visit.    Review of Systems     Objective    BP 138/74 (BP Location: Right Arm, Patient Position: Sitting, Cuff Size: Normal)   Pulse 81   Temp 98.4 F (36.9 C) (Oral)   Wt 162 lb (73.5 kg)   SpO2 97%   BMI 26.15 kg/m  {Show previous vital signs (optional):23777}  Physical Exam Vitals reviewed.  Constitutional:      Appearance: She is well-developed.  HENT:     Head: Normocephalic and atraumatic.     Right Ear: External ear normal.     Left Ear: External ear normal.  Nose: Nose normal.  Eyes:     Conjunctiva/sclera: Conjunctivae normal.  Neck:     Thyroid: No thyromegaly.  Cardiovascular:     Rate and Rhythm: Normal rate and regular rhythm.     Heart sounds: Normal heart sounds.  Pulmonary:     Effort: Pulmonary effort is normal.     Breath sounds: Normal breath sounds.  Chest:  Breasts:    Right: Normal.     Left: Normal.  Abdominal:     Palpations: Abdomen is soft.  Musculoskeletal:     Cervical back: Neck supple.  Skin:    General: Skin is warm and dry.     Comments: Very fair skin.  Neurological:     Mental Status: She is alert and oriented to person, place, and time. Mental status is at baseline.     Comments:  Positive Tinel sign on the left. Strength in the left hand and arm is normal.  Psychiatric:        Mood and Affect: Mood normal.        Behavior: Behavior normal.        Thought Content: Thought content normal.        Judgment: Judgment normal.      No results found for any visits on 11/07/20.  Assessment & Plan     1. Carpal tunnel syndrome of left wrist Try cock-up wrist splint and prednisone 20 mg daily for 5 days to try to avoid side effects of tapering nonsteroidal  2. Essential (primary) hypertension Good control on lisinopril  3. Need for influenza vaccination  - Flu Vaccine QUAD High Dose(Fluad)  4. Hypercholesterolemia without hypertriglyceridemia On atorvastatin 20  5. Mild cognitive disorder MMSE on next visit  6. Primary osteoarthritis of both knees    No follow-ups on file.      I, [name], MD, have reviewed all documentation for this visit. The documentation on 11/12/20 for the exam, diagnosis, procedures, and orders are all accurate and complete.    Camey Edell Cranford Mon, MD  [facility] [phone] (phone) [phone] (fax)  [place]

## 2020-11-07 NOTE — Patient Instructions (Signed)
Wear cock up wrist splint on left arm.

## 2020-12-05 DIAGNOSIS — D485 Neoplasm of uncertain behavior of skin: Secondary | ICD-10-CM | POA: Diagnosis not present

## 2020-12-05 DIAGNOSIS — C44722 Squamous cell carcinoma of skin of right lower limb, including hip: Secondary | ICD-10-CM | POA: Diagnosis not present

## 2020-12-15 DIAGNOSIS — Z48817 Encounter for surgical aftercare following surgery on the skin and subcutaneous tissue: Secondary | ICD-10-CM | POA: Diagnosis not present

## 2020-12-21 ENCOUNTER — Encounter: Payer: Self-pay | Admitting: Family Medicine

## 2021-01-23 DIAGNOSIS — H401121 Primary open-angle glaucoma, left eye, mild stage: Secondary | ICD-10-CM | POA: Diagnosis not present

## 2021-01-30 DIAGNOSIS — H40001 Preglaucoma, unspecified, right eye: Secondary | ICD-10-CM | POA: Diagnosis not present

## 2021-02-02 DIAGNOSIS — L905 Scar conditions and fibrosis of skin: Secondary | ICD-10-CM | POA: Diagnosis not present

## 2021-02-02 DIAGNOSIS — C44722 Squamous cell carcinoma of skin of right lower limb, including hip: Secondary | ICD-10-CM | POA: Diagnosis not present

## 2021-02-17 ENCOUNTER — Other Ambulatory Visit: Payer: Self-pay | Admitting: Family Medicine

## 2021-02-17 DIAGNOSIS — I10 Essential (primary) hypertension: Secondary | ICD-10-CM

## 2021-02-17 NOTE — Telephone Encounter (Signed)
Requested medication (s) are due for refill today: yes  Requested medication (s) are on the active medication list: yes  Last refill:  08/04/20 #90/1  Future visit scheduled: yes  Notes to clinic:  Unable to refill per protocol due to failed labs, no updated results.      Requested Prescriptions  Pending Prescriptions Disp Refills   lisinopril (ZESTRIL) 10 MG tablet [Pharmacy Med Name: Lisinopril 10 MG Oral Tablet] 90 tablet 0    Sig: Take 1 tablet by mouth once daily     Cardiovascular:  ACE Inhibitors Failed - 02/17/2021  9:50 AM      Failed - Cr in normal range and within 180 days    Creatinine, Ser  Date Value Ref Range Status  08/04/2020 0.84 0.57 - 1.00 mg/dL Final          Failed - K in normal range and within 180 days    Potassium  Date Value Ref Range Status  08/04/2020 4.6 3.5 - 5.2 mmol/L Final          Passed - Patient is not pregnant      Passed - Last BP in normal range    BP Readings from Last 1 Encounters:  11/07/20 138/74          Passed - Valid encounter within last 6 months    Recent Outpatient Visits           3 months ago Carpal tunnel syndrome of left wrist   [facility] Jerrol Banana., MD   6 months ago Encounter for [employer] annual wellness exam   [facility] Jerrol Banana., MD   1 year ago Essential (primary) hypertension   [facility] Jerrol Banana., MD   1 year ago Female genital prolapse, unspecified type   [facility] Trinna Post, [place]   1 year ago Annual physical exam   [facility] Jerrol Banana., MD       Future Appointments             In 2 months Jerrol Banana., MD [facility] [place]

## 2021-03-03 DIAGNOSIS — M4722 Other spondylosis with radiculopathy, cervical region: Secondary | ICD-10-CM | POA: Diagnosis not present

## 2021-03-03 DIAGNOSIS — R200 Anesthesia of skin: Secondary | ICD-10-CM | POA: Diagnosis not present

## 2021-03-15 ENCOUNTER — Other Ambulatory Visit: Payer: Self-pay | Admitting: Neurosurgery

## 2021-03-15 DIAGNOSIS — M4722 Other spondylosis with radiculopathy, cervical region: Secondary | ICD-10-CM

## 2021-03-15 DIAGNOSIS — R29898 Other symptoms and signs involving the musculoskeletal system: Secondary | ICD-10-CM

## 2021-03-28 ENCOUNTER — Ambulatory Visit
Admission: RE | Admit: 2021-03-28 | Discharge: 2021-03-28 | Disposition: A | Discharge status: Home / Self Care | Payer: Medicare Other | Source: Ambulatory Visit | Attending: Neurosurgery | Admitting: Neurosurgery

## 2021-03-28 ENCOUNTER — Other Ambulatory Visit: Payer: Self-pay

## 2021-03-28 DIAGNOSIS — R29898 Other symptoms and signs involving the musculoskeletal system: Secondary | ICD-10-CM | POA: Diagnosis not present

## 2021-03-28 DIAGNOSIS — M4722 Other spondylosis with radiculopathy, cervical region: Secondary | ICD-10-CM | POA: Insufficient documentation

## 2021-03-28 DIAGNOSIS — M47812 Spondylosis without myelopathy or radiculopathy, cervical region: Secondary | ICD-10-CM | POA: Diagnosis not present

## 2021-04-06 DIAGNOSIS — R200 Anesthesia of skin: Secondary | ICD-10-CM | POA: Diagnosis not present

## 2021-05-04 DIAGNOSIS — M4802 Spinal stenosis, cervical region: Secondary | ICD-10-CM | POA: Diagnosis not present

## 2021-05-04 DIAGNOSIS — M5412 Radiculopathy, cervical region: Secondary | ICD-10-CM | POA: Diagnosis not present

## 2021-05-08 ENCOUNTER — Ambulatory Visit: Payer: Medicare Other | Admitting: Family Medicine

## 2021-05-08 ENCOUNTER — Encounter: Payer: Self-pay | Admitting: Family Medicine

## 2021-05-08 VITALS — BP 140/61 | HR 65 | Temp 98.1°F | Wt 163.0 lb

## 2021-05-08 DIAGNOSIS — I10 Essential (primary) hypertension: Secondary | ICD-10-CM | POA: Diagnosis not present

## 2021-05-08 DIAGNOSIS — G729 Myopathy, unspecified: Secondary | ICD-10-CM | POA: Diagnosis not present

## 2021-05-08 DIAGNOSIS — G629 Polyneuropathy, unspecified: Secondary | ICD-10-CM | POA: Diagnosis not present

## 2021-05-08 DIAGNOSIS — G5602 Carpal tunnel syndrome, left upper limb: Secondary | ICD-10-CM | POA: Diagnosis not present

## 2021-05-08 DIAGNOSIS — F09 Unspecified mental disorder due to known physiological condition: Secondary | ICD-10-CM

## 2021-05-08 MED ORDER — GABAPENTIN 100 MG PO CAPS
100.0000 mg | ORAL_CAPSULE | Freq: Three times a day (TID) | ORAL | 5 refills | Status: DC
Start: 2021-05-08 — End: 2022-01-29

## 2021-05-08 NOTE — Progress Notes (Signed)
?  ?I,Elena D DeSanto,acting as a scribe for [name], MD.,have documented all relevant documentation on the behalf of [name], MD,as directed by  [name], MD while in the presence of [name], MD. ? ? ?Established patient visit ? ? ?Patient: Kaitlyn Zuniga   DOB: [birth year]   88 y.o. Female  MRN: [id number] ?Visit Date: 05/08/2021 ? ?Today's healthcare provider: [name], MD  ? ?No chief complaint on file. ? ?Subjective  ?  ?HPI  ?Overall patient is feeling well.  She does have a cervical focal myelopathy and may have carpal tunnel syndrome.  The tingling in her hands is the only complaint she has.  Seems to be worse in the left hand.  It seems to bother her mainly at night. ?Hypertension, follow-up ? ?BP Readings from Last 3 Encounters:  ?05/08/21 140/61  ?11/07/20 138/74  ?08/04/20 122/75  ? Wt Readings from Last 3 Encounters:  ?05/08/21 163 lb (73.9 kg)  ?11/07/20 162 lb (73.5 kg)  ?08/04/20 163 lb (73.9 kg)  ?  ? ?She was last seen for hypertension 6 months ago.  ?BP at that visit was as above. Management since that visit includes none. ? ? ?Symptoms: ?No chest pain No chest pressure  ?No palpitations No syncope  ?No dyspnea No orthopnea  ?No paroxysmal nocturnal dyspnea No lower extremity edema  ? ?Pertinent labs ?Lab Results  ?Component Value Date  ? CHOL 178 08/04/2020  ? HDL 43 08/04/2020  ? LDLCALC 111 (H) 08/04/2020  ? TRIG 133 08/04/2020  ? CHOLHDL 4.1 08/04/2020  ? Lab Results  ?Component Value Date  ? NA 138 08/04/2020  ? K 4.6 08/04/2020  ? CREATININE 0.84 08/04/2020  ? EGFR 68 08/04/2020  ? GLUCOSE 95 08/04/2020  ? TSH 1.700 08/04/2020  ?  ? ?The ASCVD Risk score (Arnett DK, et al., 2019) failed to calculate for the following reasons: ?  The 2019 ASCVD risk score is only valid for ages 30 to 96 ? ?--------------------------------------------------------------------------------------------------- ? ? ?Medications: ?Outpatient Medications Prior to  Visit  ?Medication Sig  ? acetaminophen (TYLENOL) 500 MG tablet Take 500 mg by mouth every 6 (six) hours as needed for mild pain.  ? atorvastatin (LIPITOR) 20 MG tablet Take by mouth.  ? Calcium Carb-Cholecalciferol (CALCIUM PLUS VITAMIN D3) 600-500 MG-UNIT CAPS Take 1 tablet by mouth daily.  ? carboxymethylcellulose (REFRESH PLUS) 0.5 % SOLN Place 1 drop into both eyes daily.  ? diclofenac Sodium (VOLTAREN) 1 % GEL Apply topically 4 (four) times daily.  ? lisinopril (ZESTRIL) 10 MG tablet Take 1 tablet by mouth once daily  ? Lysine 500 MG TABS Take 500 mg by mouth daily.   ? Melatonin 3 MG CAPS Take by mouth.  ? MULTIPLE VITAMIN PO Take 1 tablet by mouth daily.   ? predniSONE (DELTASONE) 20 MG tablet Take 1 tablet (20 mg total) by mouth daily with breakfast.  ? traMADol (ULTRAM) 50 MG tablet Take 50 mg by mouth 2 (two) times daily as needed.  ? traZODone (DESYREL) 50 MG tablet Take 1 tablet (50 mg total) by mouth at bedtime.  ? vitamin C (ASCORBIC ACID) 500 MG tablet Take by mouth daily.  ? ?No facility-administered medications prior to visit.  ? ? ?Review of Systems ? ?Last lipids ?Lab Results  ?Component Value Date  ? CHOL 178 08/04/2020  ? HDL 43 08/04/2020  ? LDLCALC 111 (H) 08/04/2020  ? TRIG 133 08/04/2020  ? CHOLHDL 4.1 08/04/2020  ? ?  ?  Objective  ?  ?BP 140/61 (BP Location: Left Arm, Patient Position: Sitting, Cuff Size: Normal)   Pulse 65   Temp 98.1 ?F (36.7 ?C) (Oral)   Wt 163 lb (73.9 kg)   SpO2 99%   BMI 26.31 kg/m?  ?BP Readings from Last 3 Encounters:  ?05/08/21 140/61  ?11/07/20 138/74  ?08/04/20 122/75  ? ?Wt Readings from Last 3 Encounters:  ?05/08/21 163 lb (73.9 kg)  ?11/07/20 162 lb (73.5 kg)  ?08/04/20 163 lb (73.9 kg)  ? ?  ? ?Physical Exam ?Vitals reviewed.  ?Constitutional:   ?   General: She is not in acute distress. ?   Appearance: She is well-developed.  ?HENT:  ?   Head: Normocephalic and atraumatic.  ?   Right Ear: Hearing normal.  ?   Left Ear: Hearing normal.  ?   Nose: Nose  normal.  ?Eyes:  ?   General: Lids are normal. No scleral icterus.    ?   Right eye: No discharge.     ?   Left eye: No discharge.  ?   Conjunctiva/sclera: Conjunctivae normal.  ?Cardiovascular:  ?   Rate and Rhythm: Normal rate and regular rhythm.  ?   Pulses: Normal pulses.  ?   Heart sounds: Normal heart sounds.  ?Pulmonary:  ?   Effort: Pulmonary effort is normal. No respiratory distress.  ?   Breath sounds: Normal breath sounds.  ?Skin: ?   General: Skin is warm and dry.  ?   Findings: No lesion or rash.  ?Neurological:  ?   General: No focal deficit present.  ?   Mental Status: She is alert and oriented to person, place, and time.  ?Psychiatric:     ?   Mood and Affect: Mood normal.     ?   Speech: Speech normal.     ?   Behavior: Behavior normal.     ?   Thought Content: Thought content normal.     ?   Judgment: Judgment normal.  ?  ? ? ?No results found for any visits on 05/08/21. ? Assessment & Plan  ?  ? ?1. Cervical myopathy ?This could be myelopathy versus carpal tunnel syndrome either way we will try gabapentin 100 mg starting with 1 at night and going to 3 times daily. ?- gabapentin (NEURONTIN) 100 MG capsule; Take 1 capsule (100 mg total) by mouth 3 (three) times daily.  Dispense: 30 capsule; Refill: 5 ? ?2. Neuropathy ? ?- gabapentin (NEURONTIN) 100 MG capsule; Take 1 capsule (100 mg total) by mouth 3 (three) times daily.  Dispense: 30 capsule; Refill: 5 ? ?3. Carpal tunnel syndrome of left wrist ?Consider carpal tunnel syndrome with cock-up wrist splints. ? ?4. Essential (primary) hypertension ?Good blood pressure control. ? ?5. Mild cognitive disorder ?MMSE on next visit.  She seems to be doing fine from a cognitive standpoint and is stable ? ? ?No follow-ups on file.  ?   ? ?I, [name], MD, have reviewed all documentation for this visit. The documentation on 05/11/21 for the exam, diagnosis, procedures, and orders are all accurate and complete. ? ? ? ?Dasani Thurlow Cranford Mon, MD  ?[facility] ?[phone] (phone) ?[phone] (fax) ? ?[facility] ?

## 2021-05-17 DIAGNOSIS — R200 Anesthesia of skin: Secondary | ICD-10-CM | POA: Diagnosis not present

## 2021-05-22 DIAGNOSIS — G8929 Other chronic pain: Secondary | ICD-10-CM | POA: Diagnosis not present

## 2021-05-22 DIAGNOSIS — M25561 Pain in right knee: Secondary | ICD-10-CM | POA: Diagnosis not present

## 2021-05-22 DIAGNOSIS — M5416 Radiculopathy, lumbar region: Secondary | ICD-10-CM | POA: Diagnosis not present

## 2021-06-19 DIAGNOSIS — Z85828 Personal history of other malignant neoplasm of skin: Secondary | ICD-10-CM | POA: Diagnosis not present

## 2021-06-19 DIAGNOSIS — D2262 Melanocytic nevi of left upper limb, including shoulder: Secondary | ICD-10-CM | POA: Diagnosis not present

## 2021-06-19 DIAGNOSIS — D2271 Melanocytic nevi of right lower limb, including hip: Secondary | ICD-10-CM | POA: Diagnosis not present

## 2021-06-19 DIAGNOSIS — D2261 Melanocytic nevi of right upper limb, including shoulder: Secondary | ICD-10-CM | POA: Diagnosis not present

## 2021-07-17 DIAGNOSIS — G5602 Carpal tunnel syndrome, left upper limb: Secondary | ICD-10-CM | POA: Diagnosis not present

## 2021-07-17 DIAGNOSIS — M50122 Cervical disc disorder at C5-C6 level with radiculopathy: Secondary | ICD-10-CM | POA: Diagnosis not present

## 2021-07-26 ENCOUNTER — Other Ambulatory Visit: Payer: Self-pay | Admitting: *Deleted

## 2021-07-26 DIAGNOSIS — F09 Unspecified mental disorder due to known physiological condition: Secondary | ICD-10-CM

## 2021-07-26 DIAGNOSIS — M170 Bilateral primary osteoarthritis of knee: Secondary | ICD-10-CM

## 2021-07-26 DIAGNOSIS — G629 Polyneuropathy, unspecified: Secondary | ICD-10-CM

## 2021-07-26 DIAGNOSIS — I10 Essential (primary) hypertension: Secondary | ICD-10-CM | POA: Diagnosis not present

## 2021-07-26 DIAGNOSIS — G4700 Insomnia, unspecified: Secondary | ICD-10-CM

## 2021-07-26 DIAGNOSIS — E7800 Pure hypercholesterolemia, unspecified: Secondary | ICD-10-CM | POA: Diagnosis not present

## 2021-07-26 NOTE — Progress Notes (Signed)
Established patient visit  I,[name],acting as a scribe for [name], MD.,have documented all relevant documentation on the behalf of [name], MD,as directed by  [name], MD while in the presence of [name], MD.   Patient: Kaitlyn Zuniga   DOB: 01/30/1932   89 y.o. Female  MRN: [id number] Visit Date: 07/27/2021  Today's healthcare provider: [name], MD   Chief Complaint  Patient presents with   Follow-upChronic Disease   Subjective    HPI  Patient feels well and has no complaints.  She continues to live independently. Hypertension, follow-up  BP Readings from Last 3 Encounters:  07/27/21 (!) 146/75  05/08/21 140/61  11/07/20 138/74   Wt Readings from Last 3 Encounters:  07/27/21 163 lb 12.8 oz (74.3 kg)  05/08/21 163 lb (73.9 kg)  11/07/20 162 lb (73.5 kg)     She was last seen for hypertension 2 months ago.  Management since that visit includes; Good blood pressure control.  Outside blood pressures are 120's-130's/60's.  Pertinent labs Lab Results  Component Value Date   CHOL 165 07/26/2021   HDL 43 07/26/2021   LDLCALC 97 07/26/2021   TRIG 142 07/26/2021   CHOLHDL 3.8 07/26/2021   Lab Results  Component Value Date   NA 137 07/26/2021   K 4.5 07/26/2021   CREATININE 0.89 07/26/2021   EGFR 63 07/26/2021   GLUCOSE 95 07/26/2021   TSH 2.030 07/26/2021     The ASCVD Risk score (Arnett DK, et al., 2019) failed to calculate for the following reasons:   The 2019 ASCVD risk score is only valid for ages 74 to 66  --------------------------------------------------------------------------------------------------- Follow up for [facility]:  The patient was last seen for this 2 months ago. Changes made at last visit include; MMSE on next visit.  She seems to be doing fine from a cognitive standpoint and is  stable. -----------------------------------------------------------------------------------------    07/27/2021    8:31 AM 08/04/2020   10:20 AM 07/22/2019   10:58 AM  MMSE - Mini Mental State Exam  Orientation to time 5 5 3   Orientation to Place 5 5 5   Registration 3 3 3   Attention/ Calculation 5 5 3   Recall 3 3 3   Language- name 2 objects 2 2 2   Language- repeat 1 1 1   Language- follow 3 step command 3 3 2   Language- read & follow direction 1 1 1   Write a sentence 1 1 1   Copy design 1 1 1   Total score 30 30 25      Medications: Outpatient Medications Prior to Visit  Medication Sig   acetaminophen (TYLENOL) 500 MG tablet Take 500 mg by mouth every 6 (six) hours as needed for mild pain.   atorvastatin (LIPITOR) 20 MG tablet Take by mouth.   Calcium Carb-Cholecalciferol (CALCIUM PLUS VITAMIN D3) 600-500 MG-UNIT CAPS Take 1 tablet by mouth daily.   carboxymethylcellulose (REFRESH PLUS) 0.5 % SOLN Place 1 drop into both eyes daily.   diclofenac Sodium (VOLTAREN) 1 % GEL Apply topically 4 (four) times daily.   gabapentin (NEURONTIN) 100 MG capsule Take 1 capsule (100 mg total) by mouth 3 (three) times daily.   lisinopril (ZESTRIL) 10 MG tablet Take 1 tablet by mouth once daily   Lysine 500 MG TABS Take 500 mg by mouth daily.    Melatonin 3 MG CAPS Take by mouth.   MULTIPLE VITAMIN PO Take 1 tablet by mouth daily.  vitamin C (ASCORBIC ACID) 500 MG tablet Take by mouth daily.   predniSONE (DELTASONE) 20 MG tablet Take 1 tablet (20 mg total) by mouth daily with breakfast.   traMADol (ULTRAM) 50 MG tablet Take 50 mg by mouth 2 (two) times daily as needed.   traZODone (DESYREL) 50 MG tablet Take 1 tablet (50 mg total) by mouth at bedtime.   No facility-administered medications prior to visit.    Review of Systems  Constitutional:  Negative for appetite change, chills, fatigue and fever.  Respiratory:  Negative for chest tightness and shortness of breath.   Cardiovascular:  Positive  for leg swelling. Negative for chest pain and palpitations.  Gastrointestinal:  Negative for abdominal pain, nausea and vomiting.  Neurological:  Negative for dizziness and weakness.    Last hemoglobin A1c No results found for: "HGBA1C"     Objective    BP (!) 146/75 (BP Location: Right Arm, Patient Position: Sitting, Cuff Size: Normal)   Pulse 74   Temp 98 F (36.7 C) (Oral)   Resp 16   Ht $R'5\' 6"'NI$  (1.676 m)   Wt 163 lb 12.8 oz (74.3 kg)   BMI 26.44 kg/m  BP Readings from Last 3 Encounters:  07/27/21 (!) 146/75  05/08/21 140/61  11/07/20 138/74   Wt Readings from Last 3 Encounters:  07/27/21 163 lb 12.8 oz (74.3 kg)  05/08/21 163 lb (73.9 kg)  11/07/20 162 lb (73.5 kg)      Physical Exam Vitals reviewed.  Constitutional:      Appearance: She is well-developed.  HENT:     Head: Normocephalic and atraumatic.     Right Ear: External ear normal.     Left Ear: External ear normal.     Nose: Nose normal.  Eyes:     Conjunctiva/sclera: Conjunctivae normal.  Neck:     Thyroid: No thyromegaly.  Cardiovascular:     Rate and Rhythm: Normal rate and regular rhythm.     Heart sounds: Normal heart sounds.  Pulmonary:     Effort: Pulmonary effort is normal.     Breath sounds: Normal breath sounds.  Chest:  Breasts:    Right: Normal.     Left: Normal.  Abdominal:     Palpations: Abdomen is soft.  Musculoskeletal:     Cervical back: Neck supple.  Skin:    General: Skin is warm and dry.     Comments: Very fair skin.  Neurological:     Mental Status: She is alert and oriented to person, place, and time. Mental status is at baseline.  Psychiatric:        Mood and Affect: Mood normal.        Behavior: Behavior normal.        Thought Content: Thought content normal.        Judgment: Judgment normal.       No results found for any visits on 07/27/21.  Assessment & Plan     1. Essential (primary) hypertension Blood pressure control.  2. Mild cognitive  disorder Clinically stable.  MMSE 30/30.  3. Hypercholesterolemia without hypertriglyceridemia   4. Neuropathy No falls recently.  5. AK (actinic keratosis) He looks good today.   No follow-ups on file.      I, [name], MD, have reviewed all documentation for this visit. The documentation on 07/29/21 for the exam, diagnosis, procedures, and orders are all accurate and complete.    Malai Lady Cranford Mon, MD  [facility] [address] [address] [phone] (phone) [phone] (  fax)  [facility] 

## 2021-07-27 ENCOUNTER — Encounter: Payer: Self-pay | Admitting: Family Medicine

## 2021-07-27 ENCOUNTER — Ambulatory Visit: Payer: Medicare Other | Admitting: Family Medicine

## 2021-07-27 VITALS — BP 146/75 | HR 74 | Temp 98.0°F | Resp 16 | Ht 66.0 in | Wt 163.8 lb

## 2021-07-27 DIAGNOSIS — F09 Unspecified mental disorder due to known physiological condition: Secondary | ICD-10-CM | POA: Diagnosis not present

## 2021-07-27 DIAGNOSIS — G629 Polyneuropathy, unspecified: Secondary | ICD-10-CM

## 2021-07-27 DIAGNOSIS — L570 Actinic keratosis: Secondary | ICD-10-CM

## 2021-07-27 DIAGNOSIS — E7800 Pure hypercholesterolemia, unspecified: Secondary | ICD-10-CM

## 2021-07-27 DIAGNOSIS — I10 Essential (primary) hypertension: Secondary | ICD-10-CM

## 2021-07-27 LAB — CBC WITH DIFFERENTIAL/PLATELET
Basophils Absolute: 0 10*3/uL (ref 0.0–0.2)
Basos: 0 %
EOS (ABSOLUTE): 0 10*3/uL (ref 0.0–0.4)
Eos: 0 %
Hematocrit: 37.4 % (ref 34.0–46.6)
Hemoglobin: 12.3 g/dL (ref 11.1–15.9)
Immature Grans (Abs): 0.1 10*3/uL (ref 0.0–0.1)
Immature Granulocytes: 1 %
Lymphocytes Absolute: 1.9 10*3/uL (ref 0.7–3.1)
Lymphs: 43 %
MCH: 29.8 pg (ref 26.6–33.0)
MCHC: 32.9 g/dL (ref 31.5–35.7)
MCV: 91 fL (ref 79–97)
Monocytes Absolute: 1 10*3/uL — ABNORMAL HIGH (ref 0.1–0.9)
Monocytes: 23 %
Neutrophils Absolute: 1.5 10*3/uL (ref 1.4–7.0)
Neutrophils: 33 %
Platelets: 155 10*3/uL (ref 150–450)
RBC: 4.13 x10E6/uL (ref 3.77–5.28)
RDW: 13.1 % (ref 11.7–15.4)
WBC: 4.5 10*3/uL (ref 3.4–10.8)

## 2021-07-27 LAB — LIPID PANEL
Chol/HDL Ratio: 3.8 ratio (ref 0.0–4.4)
Cholesterol, Total: 165 mg/dL (ref 100–199)
HDL: 43 mg/dL (ref >39)
LDL Chol Calc (NIH): 97 mg/dL (ref 0–99)
Triglycerides: 142 mg/dL (ref 0–149)
VLDL Cholesterol Cal: 25 mg/dL (ref 5–40)

## 2021-07-27 LAB — COMPREHENSIVE METABOLIC PANEL
ALT: 7 IU/L (ref 0–32)
AST: 16 IU/L (ref 0–40)
Albumin/Globulin Ratio: 2 (ref 1.2–2.2)
Albumin: 4.5 g/dL (ref 3.7–4.7)
Alkaline Phosphatase: 48 IU/L (ref 44–121)
BUN/Creatinine Ratio: 17 (ref 12–28)
BUN: 15 mg/dL (ref 8–27)
Bilirubin Total: 0.6 mg/dL (ref 0.0–1.2)
CO2: 24 mmol/L (ref 20–29)
Calcium: 9.3 mg/dL (ref 8.7–10.3)
Chloride: 103 mmol/L (ref 96–106)
Creatinine, Ser: 0.89 mg/dL (ref 0.57–1.00)
Globulin, Total: 2.2 g/dL (ref 1.5–4.5)
Glucose: 95 mg/dL (ref 70–99)
Potassium: 4.5 mmol/L (ref 3.5–5.2)
Sodium: 137 mmol/L (ref 134–144)
Total Protein: 6.7 g/dL (ref 6.0–8.5)
eGFR: 63 mL/min/{1.73_m2} (ref >59)

## 2021-07-27 LAB — TSH: TSH: 2.03 u[IU]/mL (ref 0.450–4.500)

## 2021-08-02 DIAGNOSIS — H903 Sensorineural hearing loss, bilateral: Secondary | ICD-10-CM | POA: Diagnosis not present

## 2021-08-02 DIAGNOSIS — H6122 Impacted cerumen, left ear: Secondary | ICD-10-CM | POA: Diagnosis not present

## 2021-08-03 ENCOUNTER — Other Ambulatory Visit: Payer: Self-pay | Admitting: Family Medicine

## 2021-08-03 DIAGNOSIS — Z1231 Encounter for screening mammogram for malignant neoplasm of breast: Secondary | ICD-10-CM

## 2021-08-07 ENCOUNTER — Telehealth: Payer: Self-pay | Admitting: Family Medicine

## 2021-08-07 NOTE — Telephone Encounter (Signed)
Copied from [employer]. Topic: Medicare AWV >> Aug 07, 2021 11:26 AM Jae Dire wrote: Reason for CRM:  Left message for patient to call back and schedule Medicare Annual Wellness Visit (AWV) in office.   If unable to come into the office for AWV,  please offer to do virtually or by telephone.  Last AWV: 08/04/2020  Please schedule at anytime with [facility] Advisor.  30 minute appointment for Virtual or phone 45 minute appointment for in office or Initial virtual/phone  Any questions, please contact me at [phone]

## 2021-08-11 DIAGNOSIS — H401121 Primary open-angle glaucoma, left eye, mild stage: Secondary | ICD-10-CM | POA: Diagnosis not present

## 2021-08-11 DIAGNOSIS — H2513 Age-related nuclear cataract, bilateral: Secondary | ICD-10-CM | POA: Diagnosis not present

## 2021-08-11 DIAGNOSIS — H40001 Preglaucoma, unspecified, right eye: Secondary | ICD-10-CM | POA: Diagnosis not present

## 2021-08-30 ENCOUNTER — Ambulatory Visit (INDEPENDENT_AMBULATORY_CARE_PROVIDER_SITE_OTHER): Payer: Medicare Other

## 2021-08-30 VITALS — BP 150/82 | Ht 66.0 in | Wt 166.5 lb

## 2021-08-30 DIAGNOSIS — Z0000 Encounter for general adult medical examination without abnormal findings: Secondary | ICD-10-CM | POA: Diagnosis not present

## 2021-08-30 NOTE — Patient Instructions (Signed)
Kaitlyn Zuniga , Thank you for taking time to come for your Medicare Wellness Visit. I appreciate your ongoing commitment to your health goals. Please review the following plan we discussed and let me know if I can assist you in the future.   Screening recommendations/referrals: Colonoscopy: aged out Mammogram: aged out Bone Density: aged out Recommended yearly ophthalmology/optometry visit for glaucoma screening and checkup Recommended yearly dental visit for hygiene and checkup  Vaccinations: Influenza vaccine: 11/07/20 Pneumococcal vaccine: 09/24/13 Tdap vaccine: 06/21/20 Shingles vaccine: Shingrix 06/12/19   Zostavax 01/10/06   Covid-19:02/09/19, 03/02/19, 10/07/19, 09/05/20  Advanced directives: no  Conditions/risks identified: eat more veggies/fruits  Next appointment: Follow up in one year for your annual wellness visit 09/03/22 @ 2 pm by phone   Preventive Care 65 Years and Older, Female Preventive care refers to lifestyle choices and visits with your health care provider that can promote health and wellness. What does preventive care include? A yearly physical exam. This is also called an annual well check. Dental exams once or twice a year. Routine eye exams. Ask your health care provider how often you should have your eyes checked. Personal lifestyle choices, including: Daily care of your teeth and gums. Regular physical activity. Eating a healthy diet. Avoiding tobacco and drug use. Limiting alcohol use. Practicing safe sex. Taking low-dose aspirin every day. Taking vitamin and mineral supplements as recommended by your health care provider. What happens during an annual well check? The services and screenings done by your health care provider during your annual well check will depend on your age, overall health, lifestyle risk factors, and family history of disease. Counseling  Your health care provider may ask you questions about your: Alcohol use. Tobacco use. Drug  use. Emotional well-being. Home and relationship well-being. Sexual activity. Eating habits. History of falls. Memory and ability to understand (cognition). Work and work Statistician. Reproductive health. Screening  You may have the following tests or measurements: Height, weight, and BMI. Blood pressure. Lipid and cholesterol levels. These may be checked every 5 years, or more frequently if you are over 70 years old. Skin check. Lung cancer screening. You may have this screening every year starting at age 2 if you have a 30-pack-year history of smoking and currently smoke or have quit within the past 15 years. Fecal occult blood test (FOBT) of the stool. You may have this test every year starting at age 64. Flexible sigmoidoscopy or colonoscopy. You may have a sigmoidoscopy every 5 years or a colonoscopy every 10 years starting at age 47. Hepatitis C blood test. Hepatitis B blood test. Sexually transmitted disease (STD) testing. Diabetes screening. This is done by checking your blood sugar (glucose) after you have not eaten for a while (fasting). You may have this done every 1-3 years. Bone density scan. This is done to screen for osteoporosis. You may have this done starting at age 110. Mammogram. This may be done every 1-2 years. Talk to your health care provider about how often you should have regular mammograms. Talk with your health care provider about your test results, treatment options, and if necessary, the need for more tests. Vaccines  Your health care provider may recommend certain vaccines, such as: Influenza vaccine. This is recommended every year. Tetanus, diphtheria, and acellular pertussis (Tdap, Td) vaccine. You may need a Td booster every 10 years. Zoster vaccine. You may need this after age 9. Pneumococcal 13-valent conjugate (PCV13) vaccine. One dose is recommended after age 61. Pneumococcal polysaccharide (PPSV23) vaccine. One  dose is recommended after age  24. Talk to your health care provider about which screenings and vaccines you need and how often you need them. This information is not intended to replace advice given to you by your health care provider. Make sure you discuss any questions you have with your health care provider. Document Released: 01/21/2015 Document Revised: 09/14/2015 Document Reviewed: 10/26/2014 Elsevier Interactive Patient Education  2017 [employer] Prevention in the Home Falls can cause injuries. They can happen to people of all ages. There are many things you can do to make your home safe and to help prevent falls. What can I do on the outside of my home? Regularly fix the edges of walkways and driveways and fix any cracks. Remove anything that might make you trip as you walk through a door, such as a raised step or threshold. Trim any bushes or trees on the path to your home. Use bright outdoor lighting. Clear any walking paths of anything that might make someone trip, such as rocks or tools. Regularly check to see if handrails are loose or broken. Make sure that both sides of any steps have handrails. Any raised decks and porches should have guardrails on the edges. Have any leaves, snow, or ice cleared regularly. Use sand or salt on walking paths during winter. Clean up any spills in your garage right away. This includes oil or grease spills. What can I do in the bathroom? Use night lights. Install grab bars by the toilet and in the tub and shower. Do not use towel bars as grab bars. Use non-skid mats or decals in the tub or shower. If you need to sit down in the shower, use a plastic, non-slip stool. Keep the floor dry. Clean up any water that spills on the floor as soon as it happens. Remove soap buildup in the tub or shower regularly. Attach bath mats securely with double-sided non-slip rug tape. Do not have throw rugs and other things on the floor that can make you trip. What can I do in the  bedroom? Use night lights. Make sure that you have a light by your bed that is easy to reach. Do not use any sheets or blankets that are too big for your bed. They should not hang down onto the floor. Have a firm chair that has side arms. You can use this for support while you get dressed. Do not have throw rugs and other things on the floor that can make you trip. What can I do in the kitchen? Clean up any spills right away. Avoid walking on wet floors. Keep items that you use a lot in easy-to-reach places. If you need to reach something above you, use a strong step stool that has a grab bar. Keep electrical cords out of the way. Do not use floor polish or wax that makes floors slippery. If you must use wax, use non-skid floor wax. Do not have throw rugs and other things on the floor that can make you trip. What can I do with my stairs? Do not leave any items on the stairs. Make sure that there are handrails on both sides of the stairs and use them. Fix handrails that are broken or loose. Make sure that handrails are as long as the stairways. Check any carpeting to make sure that it is firmly attached to the stairs. Fix any carpet that is loose or worn. Avoid having throw rugs at the top or bottom of the  stairs. If you do have throw rugs, attach them to the floor with carpet tape. Make sure that you have a light switch at the top of the stairs and the bottom of the stairs. If you do not have them, ask someone to add them for you. What else can I do to help prevent falls? Wear shoes that: Do not have high heels. Have rubber bottoms. Are comfortable and fit you well. Are closed at the toe. Do not wear sandals. If you use a stepladder: Make sure that it is fully opened. Do not climb a closed stepladder. Make sure that both sides of the stepladder are locked into place. Ask someone to hold it for you, if possible. Clearly mark and make sure that you can see: Any grab bars or  handrails. First and last steps. Where the edge of each step is. Use tools that help you move around (mobility aids) if they are needed. These include: Canes. Walkers. Scooters. Crutches. Turn on the lights when you go into a dark area. Replace any light bulbs as soon as they burn out. Set up your furniture so you have a clear path. Avoid moving your furniture around. If any of your floors are uneven, fix them. If there are any pets around you, be aware of where they are. Review your medicines with your doctor. Some medicines can make you feel dizzy. This can increase your chance of falling. Ask your doctor what other things that you can do to help prevent falls. This information is not intended to replace advice given to you by your health care provider. Make sure you discuss any questions you have with your health care provider. Document Released: 10/21/2008 Document Revised: 06/02/2015 Document Reviewed: 01/29/2014 Elsevier Interactive Patient Education  2017 [employer].

## 2021-08-30 NOTE — Progress Notes (Signed)
Subjective:   LUCINDY BOREL is a 88 y.o. female who presents for Medicare Annual (Subsequent) preventive examination.  Review of Systems           Objective:    Today's Vitals   08/30/21 1328  BP: (!) 150/82  Weight: 166 lb 8 oz (75.5 kg)  Height: '5\' 6"'$  (1.676 m)   Body mass index is 26.87 kg/m.     12/18/2019    3:28 PM 04/16/2019    9:01 AM 11/10/2018    8:30 AM 11/06/2018   10:15 AM 03/29/2016    8:18 AM 09/29/2015    9:12 AM 03/28/2015    8:13 AM  Advanced Directives  Does Patient Have a Medical Advance Directive? No Yes Yes Yes Yes Yes No  Type of Corporate treasurer of [place];Living will [place];Living will [place];Living will [place];Living will Living will   Does patient want to make changes to medical advance directive?   No - Patient declined No - Patient declined     Copy of [place] in Chart?  No - copy requested No - copy requested  No - copy requested      Current Medications (verified) Outpatient Encounter Medications as of 08/30/2021  Medication Sig   acetaminophen (TYLENOL) 500 MG tablet Take 500 mg by mouth every 6 (six) hours as needed for mild pain.   atorvastatin (LIPITOR) 20 MG tablet Take by mouth.   Calcium Carb-Cholecalciferol (CALCIUM PLUS VITAMIN D3) 600-500 MG-UNIT CAPS Take 1 tablet by mouth daily.   carboxymethylcellulose (REFRESH PLUS) 0.5 % SOLN Place 1 drop into both eyes daily.   diclofenac Sodium (VOLTAREN) 1 % GEL Apply topically 4 (four) times daily.   DULoxetine (CYMBALTA) 30 MG capsule Take by mouth.   gabapentin (NEURONTIN) 100 MG capsule Take 1 capsule (100 mg total) by mouth 3 (three) times daily.   lisinopril (ZESTRIL) 10 MG tablet Take 1 tablet by mouth once daily   Lysine 500 MG TABS Take 500 mg by mouth daily.    Melatonin 3 MG CAPS Take by mouth.   meloxicam (MOBIC) 7.5 MG tablet Take 7.5 mg by mouth daily.   MULTIPLE  VITAMIN PO Take 1 tablet by mouth daily.    mupirocin ointment (BACTROBAN) 2 % Apply topically daily.   vitamin C (ASCORBIC ACID) 500 MG tablet Take by mouth daily.   predniSONE (DELTASONE) 20 MG tablet Take 1 tablet (20 mg total) by mouth daily with breakfast. (Patient not taking: Reported on 08/30/2021)   tiZANidine (ZANAFLEX) 2 MG tablet Take 2 mg by mouth 3 (three) times daily as needed. (Patient not taking: Reported on 08/30/2021)   traMADol (ULTRAM) 50 MG tablet Take 50 mg by mouth 2 (two) times daily as needed. (Patient not taking: Reported on 08/30/2021)   traZODone (DESYREL) 50 MG tablet Take 1 tablet (50 mg total) by mouth at bedtime. (Patient not taking: Reported on 08/30/2021)   [DISCONTINUED] Melatonin 3 MG CAPS Take 1 capsule by mouth daily.   No facility-administered encounter medications on file as of 08/30/2021.    Allergies (verified) Patient has no known allergies.   History: Past Medical History:  Diagnosis Date   Arthritis    Hypertension    Past Surgical History:  Procedure Laterality Date   BREAST BIOPSY Right 2006   benign   BREAST SURGERY Right [id number]   Biopsy   HEMI-MICRODISCECTOMY LUMBAR LAMINECTOMY LEVEL 2 N/A 11/10/2018  Procedure: LEFT L3-4, L4-5 HEMILAMINECTOMY & DISCECTOMY;  Surgeon: Deetta Perla, MD;  Location: [facility] ORS;  Service: Neurosurgery;  Laterality: N/A;   Family History  Problem Relation Age of Onset   Cancer Mother        lung   Cancer Father        prostate   Hypertension Father    Breast cancer Sister 64   Social History   Socioeconomic History   Marital status: Married    Spouse name: Not on file   Number of children: 2   Years of education: Not on file   Highest education level: 10th grade  Occupational History   Occupation: retired  Tobacco Use   Smoking status: Former    Years: 10.00    Types: Cigarettes   Smokeless tobacco: Never   Tobacco comments:    quit 30 years ago.   Vaping Use   Vaping Use: Never used   Substance and Sexual Activity   Alcohol use: No    Alcohol/week: 0.0 standard drinks of alcohol   Drug use: No   Sexual activity: Not on file  Other Topics Concern   Not on file  Social History Narrative   Not on file   Social Determinants of Health   Financial Resource Strain: Low Risk  (08/30/2021)   Overall Financial Resource Strain (CARDIA)    Difficulty of Paying Living Expenses: Not hard at all  Food Insecurity: No Food Insecurity (08/30/2021)   Hunger Vital Sign    Worried About Running Out of Food in the Last Year: Never true    Ran Out of Food in the Last Year: Never true  Transportation Needs: No Transportation Needs (08/30/2021)   PRAPARE - Hydrologist (Medical): No    Lack of Transportation (Non-Medical): No  Physical Activity: Insufficiently Active (08/30/2021)   Exercise Vital Sign    Days of Exercise per Week: 3 days    Minutes of Exercise per Session: 40 min  Stress: No Stress Concern Present (08/30/2021)   [place]    Feeling of Stress : Not at all  Social Connections: Moderately Isolated (08/30/2021)   Social Connection and Isolation Panel [NHANES]    Frequency of Communication with Friends and Family: More than three times a week    Frequency of Social Gatherings with Friends and Family: Once a week    Attends Religious Services: Never    Marine scientist or Organizations: No    Attends Archivist Meetings: Never    Marital Status: Married    Tobacco Counseling Counseling given: Not Answered Tobacco comments: quit 30 years ago.    Clinical Intake:  Pre-visit preparation completed: Yes  Pain : No/denies pain     Nutritional Risks: None Diabetes: No  How often do you need to have someone help you when you read instructions, pamphlets, or other written materials from your doctor or pharmacy?: 1 - Never  Diabetic?no  Interpreter  Needed?: No  Information entered by :: Kirke Shaggy, LPN   Activities of Daily Living    11/07/2020   10:28 AM  In your present state of health, do you have any difficulty performing the following activities:  Hearing? 1  Vision? 0  Difficulty concentrating or making decisions? 0  Walking or climbing stairs? 1  Dressing or bathing? 0  Doing errands, shopping? 0    Patient Care Team: Jerrol Banana., MD as  PCP - General (Family Medicine) Dasher, Rayvon Char, MD (Dermatology) Clyde Canterbury, MD as Referring Physician (Otolaryngology) Kipp Laurence, MD as Referring Physician (Audiology) Leandrew Koyanagi, MD as Referring Physician (Ophthalmology) Marlowe Sax, MD as Referring Physician (Internal Medicine)  Indicate any recent Medical Services you may have received from other than [facility] providers in the past year (date may be approximate).     Assessment:   This is a routine wellness examination for Sheletha.  Hearing/Vision screen No results found.  Dietary issues and exercise activities discussed:     Goals Addressed             This Visit's Progress    DIET - EAT MORE FRUITS AND VEGETABLES         Depression Screen    08/30/2021    1:32 PM 11/07/2020   10:27 AM 08/04/2020    9:59 AM 12/18/2019    3:25 PM 07/22/2019   10:59 AM 04/16/2019    8:57 AM 06/23/2018    9:19 AM  PHQ 2/9 Scores  PHQ - 2 Score 0 0 0 0 0 0 0  PHQ- 9 Score 0 2 2 0 1  1    Fall Risk    11/07/2020   10:27 AM 08/04/2020    9:59 AM 12/18/2019    3:29 PM 07/22/2019   10:59 AM 04/16/2019    9:01 AM  Fall Risk   Falls in the past year? 0 0 0 0 0  Number falls in past yr: 0 0  0 0  Injury with Fall? 0 0  0 0  Risk for fall due to :  Mental status change     Follow up  Falls evaluation completed  Falls evaluation completed     FALL RISK PREVENTION PERTAINING TO THE HOME:  Any stairs in or around the home? No  If so, are there any without handrails? No  Home free  of loose throw rugs in walkways, pet beds, electrical cords, etc? Yes  Adequate lighting in your home to reduce risk of falls? Yes   ASSISTIVE DEVICES UTILIZED TO PREVENT FALLS:  Life alert? No  Use of a cane, walker or w/c? No  Grab bars in the bathroom? No  Shower chair or bench in shower? No  Elevated toilet seat or a handicapped toilet? No   TIMED UP AND GO:  Was the test performed? Yes .  Length of time to ambulate 10 feet: 4 sec.   Gait steady and fast without use of assistive device  Cognitive Function:    07/27/2021    8:31 AM 08/04/2020   10:20 AM 07/22/2019   10:58 AM 07/03/2017    8:00 AM  MMSE - Mini Mental State Exam  Orientation to time '5 5 3 5  '$ Orientation to Place '5 5 5 5  '$ Registration '3 3 3 3  '$ Attention/ Calculation '5 5 3 5  '$ Recall '3 3 3 2  '$ Language- name 2 objects '2 2 2 2  '$ Language- repeat '1 1 1 1  '$ Language- follow 3 step command '3 3 2 3  '$ Language- read & follow direction '1 1 1 1  '$ Write a sentence '1 1 1 1  '$ Copy design '1 1 1 1  '$ Total score '30 30 25 29        '$ Immunizations Immunization History  Administered Date(s) Administered   Fluad Quad(high Dose 65+) 11/07/2020   Influenza, High Dose Seasonal PF 09/28/2014, 09/29/2015, 10/01/2016, 12/26/2017, 10/26/2019   Influenza-Unspecified 09/26/2018  PFIZER(Purple Top)SARS-COV-2 Vaccination 02/09/2019, 03/02/2019, 10/07/2019, 09/05/2020   Pneumococcal Conjugate-13 09/24/2013   Pneumococcal Polysaccharide-23 11/16/1999   Td 06/06/2004   Tdap 10/18/2010, 06/21/2020   Zoster Recombinat (Shingrix) 06/12/2019   Zoster, Live 01/10/2006    TDAP status: Up to date  Flu Vaccine status: Up to date  Pneumococcal vaccine status: Up to date  Covid-19 vaccine status: Completed vaccines  Qualifies for Shingles Vaccine? Yes   Zostavax completed Yes   Shingrix Completed?: Yes  Screening Tests Health Maintenance  Topic Date Due   Zoster Vaccines- Shingrix (2 of 2) 08/07/2019   COVID-19 Vaccine (5 - Pfizer  risk series) 10/31/2020   INFLUENZA VACCINE  08/08/2021   DEXA SCAN  11/07/2021   TETANUS/TDAP  06/22/2030   Pneumonia Vaccine 27+ Years old  Completed   HPV VACCINES  Aged Out    Health Maintenance  Health Maintenance Due  Topic Date Due   Zoster Vaccines- Shingrix (2 of 2) 08/07/2019   COVID-19 Vaccine (5 - Pfizer risk series) 10/31/2020   INFLUENZA VACCINE  08/08/2021    Colorectal cancer screening: No longer required.   Mammogram status: No longer required due to age.   Lung Cancer Screening: (Low Dose CT Chest recommended if Age 64-80 years, 30 pack-year currently smoking OR have quit w/in 15years.) does not qualify.     Additional Screening:  Hepatitis C Screening: does not qualify; Completed no  Vision Screening: Recommended annual ophthalmology exams for early detection of glaucoma and other disorders of the eye. Is the patient up to date with their annual eye exam?  Yes  Who is the provider or what is the name of the office in which the patient attends annual eye exams? [facility] If pt is not established with a provider, would they like to be referred to a provider to establish care? No .   Dental Screening: Recommended annual dental exams for proper oral hygiene  Community Resource Referral / Chronic Care Management: CRR required this visit?  No   CCM required this visit?  No      Plan:     I have personally reviewed and noted the following in the patient's chart:   Medical and social history Use of alcohol, tobacco or illicit drugs  Current medications and supplements including opioid prescriptions. Patient is not currently taking opioid prescriptions. Functional ability and status Nutritional status Physical activity Advanced directives List of other physicians Hospitalizations, surgeries, and ER visits in previous 12 months Vitals Screenings to include cognitive, depression, and falls Referrals and appointments  In addition, I have  reviewed and discussed with patient certain preventive protocols, quality metrics, and best practice recommendations. A written personalized care plan for preventive services as well as general preventive health recommendations were provided to patient.     Dionisio David, LPN   3/76/2831   Nurse Notes: none

## 2021-08-31 DIAGNOSIS — H903 Sensorineural hearing loss, bilateral: Secondary | ICD-10-CM | POA: Diagnosis not present

## 2021-08-31 DIAGNOSIS — H6122 Impacted cerumen, left ear: Secondary | ICD-10-CM | POA: Diagnosis not present

## 2021-09-06 ENCOUNTER — Other Ambulatory Visit: Payer: Self-pay | Admitting: Family Medicine

## 2021-09-06 DIAGNOSIS — I10 Essential (primary) hypertension: Secondary | ICD-10-CM

## 2021-10-25 ENCOUNTER — Ambulatory Visit: Payer: Medicare Other | Admitting: Physician Assistant

## 2021-10-25 ENCOUNTER — Encounter: Payer: Self-pay | Admitting: Physician Assistant

## 2021-10-25 ENCOUNTER — Ambulatory Visit: Payer: Medicare Other

## 2021-10-25 VITALS — BP 158/81 | HR 76 | Ht 67.0 in | Wt 166.4 lb

## 2021-10-25 DIAGNOSIS — Z23 Encounter for immunization: Secondary | ICD-10-CM | POA: Diagnosis not present

## 2021-10-25 DIAGNOSIS — R0981 Nasal congestion: Secondary | ICD-10-CM

## 2021-10-25 DIAGNOSIS — J3089 Other allergic rhinitis: Secondary | ICD-10-CM | POA: Diagnosis not present

## 2021-10-25 MED ORDER — AZELASTINE HCL 0.1 % NA SOLN: 1.0000 | Freq: Two times a day (BID) | NASAL | 1 refills | Status: DC

## 2021-10-25 NOTE — Progress Notes (Signed)
I,Sha'taria Tyson,acting as a Education administrator for [employer], PA-C.,have documented all relevant documentation on the behalf of Mikey Kirschner, PA-C,as directed by  Mikey Kirschner, PA-C while in the presence of Mikey Kirschner, PA-C.   Established patient visit   Patient: Kaitlyn Zuniga   DOB: 02-03-1932   88 y.o. Female  MRN: [id number] Visit Date: 10/25/2021  Today's healthcare provider: Mikey Kirschner, PA-C   Cc. Nasal congestion  Subjective    HPI   Patient reports symptoms of head fullness, runny nose and dizziness this morning with other symptoms being persistent for about a week. Runny nose clear, no fever or associated symptoms. Patient reports taking allergy pill with no relief.   Medications: Outpatient Medications Prior to Visit  Medication Sig   acetaminophen (TYLENOL) 500 MG tablet Take 500 mg by mouth every 6 (six) hours as needed for mild pain.   atorvastatin (LIPITOR) 20 MG tablet Take by mouth.   Calcium Carb-Cholecalciferol (CALCIUM PLUS VITAMIN D3) 600-500 MG-UNIT CAPS Take 1 tablet by mouth daily.   carboxymethylcellulose (REFRESH PLUS) 0.5 % SOLN Place 1 drop into both eyes daily.   diclofenac Sodium (VOLTAREN) 1 % GEL Apply topically 4 (four) times daily.   DULoxetine (CYMBALTA) 30 MG capsule Take by mouth.   gabapentin (NEURONTIN) 100 MG capsule Take 1 capsule (100 mg total) by mouth 3 (three) times daily.   lisinopril (ZESTRIL) 10 MG tablet Take 1 tablet by mouth once daily   Lysine 500 MG TABS Take 500 mg by mouth daily.    Melatonin 3 MG CAPS Take by mouth.   meloxicam (MOBIC) 7.5 MG tablet Take 7.5 mg by mouth daily.   MULTIPLE VITAMIN PO Take 1 tablet by mouth daily.    mupirocin ointment (BACTROBAN) 2 % Apply topically daily.   predniSONE (DELTASONE) 20 MG tablet Take 1 tablet (20 mg total) by mouth daily with breakfast.   tiZANidine (ZANAFLEX) 2 MG tablet Take 2 mg by mouth 3 (three) times daily as needed.   traMADol (ULTRAM) 50 MG tablet Take 50 mg  by mouth 2 (two) times daily as needed.   traZODone (DESYREL) 50 MG tablet Take 1 tablet (50 mg total) by mouth at bedtime.   vitamin C (ASCORBIC ACID) 500 MG tablet Take by mouth daily.   No facility-administered medications prior to visit.      Objective    Blood pressure (!) 158/81, pulse 76, height '5\' 7"'$  (1.702 m), weight 166 lb 6.4 oz (75.5 kg), SpO2 99 %.   Physical Exam Constitutional:      General: She is awake.     Appearance: She is well-developed.  HENT:     Head: Normocephalic.     Right Ear: Tympanic membrane normal.     Left Ear: Tympanic membrane normal.     Nose: Congestion present. No rhinorrhea.     Mouth/Throat:     Pharynx: Posterior oropharyngeal erythema present. No oropharyngeal exudate.  Eyes:     Conjunctiva/sclera: Conjunctivae normal.  Cardiovascular:     Rate and Rhythm: Normal rate and regular rhythm.     Heart sounds: Normal heart sounds.  Pulmonary:     Effort: Pulmonary effort is normal.     Breath sounds: Normal breath sounds.  Skin:    General: Skin is warm.  Neurological:     Mental Status: She is alert and oriented to person, place, and time.  Psychiatric:        Attention and Perception: Attention normal.  Mood and Affect: Mood normal.        Speech: Speech normal.        Behavior: Behavior is cooperative.      No results found for any visits on 10/25/21.  Assessment & Plan     Allergic rhinitis Continue antihistamine otc, add azelastine nasal spray. Unlikely it has progressed to sinusitis quite yet   Problem List Items Addressed This Visit   None Visit Diagnoses     Allergic rhinitis due to other allergic trigger, unspecified seasonality    -  Primary   Relevant Medications   azelastine (ASTELIN) 0.1 % nasal spray   Need for immunization against influenza       Relevant Orders   Flu Vaccine QUAD High Dose(Fluad) (Completed)       Return if symptoms worsen or fail to improve.      I, Mikey Kirschner, PA-C have  reviewed all documentation for this visit. The documentation on  10/25/2021 for the exam, diagnosis, procedures, and orders are all accurate and complete.  Mikey Kirschner, PA-C [facility] [address] [place], [place], [address] Office: [phone] Fax: [place]

## 2021-10-26 ENCOUNTER — Encounter: Payer: Self-pay | Admitting: Physician Assistant

## 2021-11-01 ENCOUNTER — Ambulatory Visit: Payer: Medicare Other

## 2021-11-06 ENCOUNTER — Ambulatory Visit
Admission: RE | Admit: 2021-11-06 | Discharge: 2021-11-06 | Disposition: A | Discharge status: Home / Self Care | Payer: Medicare Other | Source: Ambulatory Visit | Attending: Family Medicine | Admitting: Family Medicine

## 2021-11-06 DIAGNOSIS — Z1231 Encounter for screening mammogram for malignant neoplasm of breast: Secondary | ICD-10-CM | POA: Insufficient documentation

## 2021-11-27 DIAGNOSIS — G5602 Carpal tunnel syndrome, left upper limb: Secondary | ICD-10-CM | POA: Diagnosis not present

## 2021-12-07 DIAGNOSIS — J340 Abscess, furuncle and carbuncle of nose: Secondary | ICD-10-CM | POA: Diagnosis not present

## 2021-12-07 DIAGNOSIS — H6122 Impacted cerumen, left ear: Secondary | ICD-10-CM | POA: Diagnosis not present

## 2021-12-15 ENCOUNTER — Telehealth: Payer: Self-pay | Admitting: Family Medicine

## 2021-12-15 DIAGNOSIS — I10 Essential (primary) hypertension: Secondary | ICD-10-CM

## 2021-12-15 MED ORDER — LISINOPRIL 10 MG PO TABS: 10.0000 mg | ORAL_TABLET | Freq: Every day | ORAL | 0 refills | Status: DC

## 2021-12-15 NOTE — Telephone Encounter (Signed)
Pharmacy requesting 90 day supply of lisinopril (ZESTRIL) 10 MG tablet

## 2022-01-21 ENCOUNTER — Other Ambulatory Visit: Payer: Self-pay | Admitting: Physician Assistant

## 2022-01-21 DIAGNOSIS — J3089 Other allergic rhinitis: Secondary | ICD-10-CM

## 2022-01-26 NOTE — Progress Notes (Signed)
I,Kaitlyn Zuniga,acting as a scribe for [employer], MD.,have documented all relevant documentation on the behalf of Kaitlyn Foster, MD,as directed by  Kaitlyn Foster, MD while in the presence of Kaitlyn Foster, MD.   Established patient visit   Patient: Kaitlyn Zuniga   DOB: 08/02/1932   89 y.o. Female  MRN: [id number] Visit Date: 01/29/2022  Today's healthcare provider: [name], MD   Chief Complaint  Patient presents with   follow-up chronic disease   Subjective    HPI   Hypertension, follow-up  BP Readings from Last 3 Encounters:  01/29/22 134/71  10/25/21 (!) 158/81  08/30/21 (!) 150/82   Wt Readings from Last 3 Encounters:  01/29/22 159 lb (72.1 kg)  10/25/21 166 lb 6.4 oz (75.5 kg)  08/30/21 166 lb 8 oz (75.5 kg)     She was last seen for hypertension 6 months ago.  BP at that visit was 146/75. Management since that visit includes continue current treatment.  She reports excellent compliance with treatment.   Outside blood pressures are for the last three days she has been getting 116's-132/60's-70's. Symptoms: No chest pain No chest pressure  No palpitations No syncope  No dyspnea No orthopnea  No paroxysmal nocturnal dyspnea No lower extremity edema   Pertinent labs Lab Results  Component Value Date   CHOL 165 07/26/2021   HDL 43 07/26/2021   LDLCALC 97 07/26/2021   TRIG 142 07/26/2021   CHOLHDL 3.8 07/26/2021   Lab Results  Component Value Date   NA 137 07/26/2021   K 4.5 07/26/2021   CREATININE 0.89 07/26/2021   EGFR 63 07/26/2021   GLUCOSE 95 07/26/2021   TSH 2.030 07/26/2021     The ASCVD Risk score (Arnett DK, et al., 2019) failed to calculate for the following reasons:   The 2019 ASCVD risk score is only valid for ages 55 to 18  --------------------------------------------------------------------------------------------------- Osteopenia:  She is agreeable  She reports  she is taking both calcium and vitamin D     DDD  OA of bilateral knees  Followed by Dr. Jefm Bryant (recently retired, per patient)  Takes meloxicam for this  She reports that she is seen in that office at least once yearly    Boil  That has been present 4-5 days  She reports symptoms of burning  Reports it has been enlarging  She states that It has been draining  She states that she has had these before and has ruputured them in the past    Medications: Outpatient Medications Prior to Visit  Medication Sig   acetaminophen (TYLENOL) 500 MG tablet Take 500 mg by mouth every 6 (six) hours as needed for mild pain.   aspirin EC 81 MG tablet Take 81 mg by mouth daily. Swallow whole.   Azelastine HCl 137 MCG/SPRAY SOLN USE 1 SPRAY(S) IN EACH NOSTRIL TWICE DAILY AS DIRECTED   Calcium Carb-Cholecalciferol (CALCIUM PLUS VITAMIN D3) 600-500 MG-UNIT CAPS Take 1 tablet by mouth daily.   carboxymethylcellulose (REFRESH PLUS) 0.5 % SOLN Place 1 drop into both eyes daily.   Cholecalciferol (D3) 50 MCG (2000 UT) TABS Take by mouth.   diclofenac Sodium (VOLTAREN) 1 % GEL Apply topically 4 (four) times daily.   lisinopril (ZESTRIL) 10 MG tablet Take 1 tablet (10 mg total) by mouth daily.   Lysine 500 MG TABS Take 500 mg by mouth daily.    Melatonin 3 MG CAPS Take by mouth.   meloxicam (MOBIC) 7.5 MG tablet  Take 7.5 mg by mouth daily.   MULTIPLE VITAMIN PO Take 1 tablet by mouth daily.    vitamin C (ASCORBIC ACID) 500 MG tablet Take by mouth daily.   atorvastatin (LIPITOR) 20 MG tablet Take by mouth. (Patient not taking: Reported on 01/29/2022)   [DISCONTINUED] DULoxetine (CYMBALTA) 30 MG capsule Take by mouth. (Patient not taking: Reported on 01/29/2022)   [DISCONTINUED] gabapentin (NEURONTIN) 100 MG capsule Take 1 capsule (100 mg total) by mouth 3 (three) times daily. (Patient not taking: Reported on 01/29/2022)   [DISCONTINUED] mupirocin ointment (BACTROBAN) 2 % Apply topically daily.    [DISCONTINUED] predniSONE (DELTASONE) 20 MG tablet Take 1 tablet (20 mg total) by mouth daily with breakfast.   [DISCONTINUED] tiZANidine (ZANAFLEX) 2 MG tablet Take 2 mg by mouth 3 (three) times daily as needed. (Patient not taking: Reported on 01/29/2022)   [DISCONTINUED] traMADol (ULTRAM) 50 MG tablet Take 50 mg by mouth 2 (two) times daily as needed. (Patient not taking: Reported on 01/29/2022)   [DISCONTINUED] traZODone (DESYREL) 50 MG tablet Take 1 tablet (50 mg total) by mouth at bedtime. (Patient not taking: Reported on 01/29/2022)   No facility-administered medications prior to visit.    Review of Systems     Objective    BP 134/71 (BP Location: Left Arm, Patient Position: Sitting, Cuff Size: Normal)   Pulse 74   Resp 16   Wt 159 lb (72.1 kg)   BMI 24.90 kg/m    Physical Exam Vitals reviewed.  Constitutional:      General: She is not in acute distress.    Appearance: Normal appearance. She is not ill-appearing, toxic-appearing or diaphoretic.  Eyes:     Conjunctiva/sclera: Conjunctivae normal.  Cardiovascular:     Rate and Rhythm: Normal rate and regular rhythm.     Pulses: Normal pulses.     Heart sounds: Normal heart sounds. No murmur heard.    No friction rub. No gallop.  Pulmonary:     Effort: Pulmonary effort is normal. No respiratory distress.     Breath sounds: Normal breath sounds. No stridor. No wheezing, rhonchi or rales.  Abdominal:     General: Bowel sounds are normal. There is no distension.     Palpations: Abdomen is soft.     Tenderness: There is no abdominal tenderness.  Musculoskeletal:     Right lower leg: No edema.     Left lower leg: No edema.  Skin:    Findings: Erythema present. Acne: 1 cm furuncle on right labia majora with surrounding erythema and tenderness to palpation no drainage at this time. Neurological:     Mental Status: She is alert and oriented to person, place, and time.       No results found for any visits on 01/29/22.   Assessment & Plan     Problem List Items Addressed This Visit       Cardiovascular and Mediastinum   Essential (primary) hypertension - Primary    Controlled BP at goal Continue lisinopril '10mg'$  daily  No medications changes today  CMP ordered       Relevant Medications   aspirin EC 81 MG tablet   Other Relevant Orders   Comprehensive metabolic panel     Musculoskeletal and Integument   Osteopenia    Stable  Patient on calcium and vitamin D supplmentation  DEXA scan ordered today        Relevant Orders   DG Bone Density   DDD (degenerative disc disease), lumbar  Stable on meloxicam 7.'5mg'$  daily Follows with orthopedics  Continue current treatment plan       Relevant Medications   aspirin EC 81 MG tablet   Osteoarthritis of both knees    Follows with orthopedics  Stable on meloxicam 7.'5mg'$   Continue current treatment plan       Relevant Medications   aspirin EC 81 MG tablet   Furuncle of labia majora    Recommended applying warm compress to help with spontaneous drainage  Prescribed mupirocin topical to apply twice daily  Patient to return if not improved in 7 days or sooner if area becomes more enlarged and tender, may need I&D if no spontaneous drainage with above mentioned measures        Relevant Medications   mupirocin ointment (BACTROBAN) 2 %     Other   Pure hypercholesterolemia    Lipid panel ordered  Not currently taking prescribed atorvastatin  Pt reports that she takes this depending on cholesterol levels        Relevant Medications   aspirin EC 81 MG tablet   Other Relevant Orders   Lipid Profile   Postmenopausal estrogen deficiency    DEXA scan ordered today       Relevant Orders   DG Bone Density    Return if symptoms worsen or fail to improve.        The entirety of the information documented in the History of Present Illness, Review of Systems and Physical Exam were personally obtained by me. Portions of this information  were initially documented by Lyndel Pleasure, CMA and reviewed by me for thoroughness and accuracy.Kaitlyn Foster, MD     Kaitlyn Foster, MD  [facility] [phone] (phone) [phone] (fax)  [place]

## 2022-01-29 ENCOUNTER — Ambulatory Visit: Payer: Medicare Other | Admitting: Family Medicine

## 2022-01-29 ENCOUNTER — Encounter: Payer: Self-pay | Admitting: Family Medicine

## 2022-01-29 VITALS — BP 134/71 | HR 74 | Resp 16 | Wt 159.0 lb

## 2022-01-29 DIAGNOSIS — I10 Essential (primary) hypertension: Secondary | ICD-10-CM

## 2022-01-29 DIAGNOSIS — M170 Bilateral primary osteoarthritis of knee: Secondary | ICD-10-CM

## 2022-01-29 DIAGNOSIS — M5136 Other intervertebral disc degeneration, lumbar region: Secondary | ICD-10-CM

## 2022-01-29 DIAGNOSIS — M8580 Other specified disorders of bone density and structure, unspecified site: Secondary | ICD-10-CM

## 2022-01-29 DIAGNOSIS — E7800 Pure hypercholesterolemia, unspecified: Secondary | ICD-10-CM

## 2022-01-29 DIAGNOSIS — N764 Abscess of vulva: Secondary | ICD-10-CM | POA: Insufficient documentation

## 2022-01-29 DIAGNOSIS — Z780 Asymptomatic menopausal state: Secondary | ICD-10-CM | POA: Insufficient documentation

## 2022-01-29 MED ORDER — MUPIROCIN 2 % EX OINT: 1.0000 | TOPICAL_OINTMENT | Freq: Two times a day (BID) | CUTANEOUS | 0 refills | Status: AC

## 2022-01-29 NOTE — Patient Instructions (Addendum)
Please call to schedule your bone density scan   [facility] [place]: [phone]   Please apply the prescribed ointment twice daily along with applying a warm washcloth to the area to help with spontaneous drainage   Please return to care in 7-10 days or if the area enlarges or becomes more painful.    Skin Abscess  A skin abscess is an infected area of your skin that contains pus and other material. An abscess can happen in any part of your body. Some abscesses break open (rupture) on their own. Most continue to get worse unless they are treated. The infection can spread deeper into the body and into your blood, which can make you feel sick. A skin abscess is caused by germs that enter the skin through a cut or scrape. It can also be caused by blocked oil and sweat glands or infected hair follicles. This condition is usually treated by: Draining the pus. Taking antibiotic medicines. Placing a warm, wet washcloth over the abscess. Follow these instructions at home:  Abscess care  If you have an abscess that has not drained, place a warm, clean, wet washcloth over the abscess several times a day. Do this as told by your doctor. Follow instructions from your doctor about how to take care of your abscess. Make sure you: Cover the abscess with a bandage (dressing). Change your bandage or gauze as told by your doctor. Wash your hands with soap and water before you change the bandage or gauze. If you cannot use soap and water, use hand sanitizer. Check your abscess every day for signs that the infection is getting worse. Check for: More redness, swelling, or pain. More fluid or blood. Warmth. More pus or a bad smell. General instructions To avoid spreading the infection: Do not share personal care items, towels, or hot tubs with others. Avoid making skin-to-skin contact with other people. Keep all follow-up visits as told by your doctor. This is  important. Contact a doctor if: You have more redness, swelling, or pain around your abscess. You have more fluid or blood coming from your abscess. Your abscess feels warm when you touch it. You have more pus or a bad smell coming from your abscess. Your muscles ache. You feel sick. Get help right away if: You have very bad (severe) pain. You see red streaks on your skin spreading away from the abscess. You see redness that spreads quickly. You have a fever or chills. Summary A skin abscess is an infected area of your skin that contains pus and other material. The abscess is caused by germs that enter the skin through a cut or scrape. It can also be caused by blocked oil and sweat glands or infected hair follicles. Follow your doctor's instructions on caring for your abscess, taking medicines, preventing infections, and keeping follow-up visits. This information is not intended to replace advice given to you by your health care provider. Make sure you discuss any questions you have with your health care provider. Document Revised: 03/30/2021 Document Reviewed: 10/03/2020 Elsevier Patient Education  [place].

## 2022-01-29 NOTE — Assessment & Plan Note (Signed)
DEXA scan ordered today 

## 2022-01-29 NOTE — Assessment & Plan Note (Signed)
Lipid panel ordered  Not currently taking prescribed atorvastatin  Pt reports that she takes this depending on cholesterol levels

## 2022-01-29 NOTE — Assessment & Plan Note (Signed)
Recommended applying warm compress to help with spontaneous drainage  Prescribed mupirocin topical to apply twice daily  Patient to return if not improved in 7 days or sooner if area becomes more enlarged and tender, may need I&D if no spontaneous drainage with above mentioned measures

## 2022-01-29 NOTE — Assessment & Plan Note (Signed)
Stable  Patient on calcium and vitamin D supplmentation  DEXA scan ordered today

## 2022-01-29 NOTE — Assessment & Plan Note (Signed)
Follows with orthopedics  Stable on meloxicam 7.'5mg'$   Continue current treatment plan

## 2022-01-29 NOTE — Assessment & Plan Note (Signed)
Stable on meloxicam 7.'5mg'$  daily Follows with orthopedics  Continue current treatment plan

## 2022-01-29 NOTE — Assessment & Plan Note (Signed)
Controlled BP at goal Continue lisinopril '10mg'$  daily  No medications changes today  CMP ordered

## 2022-01-30 LAB — COMPREHENSIVE METABOLIC PANEL
ALT: 8 IU/L (ref 0–32)
AST: 19 IU/L (ref 0–40)
Albumin/Globulin Ratio: 1.9 (ref 1.2–2.2)
Albumin: 4.6 g/dL (ref 3.7–4.7)
Alkaline Phosphatase: 72 IU/L (ref 44–121)
BUN/Creatinine Ratio: 17 (ref 12–28)
BUN: 16 mg/dL (ref 8–27)
Bilirubin Total: 0.7 mg/dL (ref 0.0–1.2)
CO2: 21 mmol/L (ref 20–29)
Calcium: 9.3 mg/dL (ref 8.7–10.3)
Chloride: 100 mmol/L (ref 96–106)
Creatinine, Ser: 0.92 mg/dL (ref 0.57–1.00)
Globulin, Total: 2.4 g/dL (ref 1.5–4.5)
Glucose: 92 mg/dL (ref 70–99)
Potassium: 3.9 mmol/L (ref 3.5–5.2)
Sodium: 142 mmol/L (ref 134–144)
Total Protein: 7 g/dL (ref 6.0–8.5)
eGFR: 60 mL/min/{1.73_m2} (ref >59)

## 2022-01-30 LAB — LIPID PANEL
Chol/HDL Ratio: 3.4 ratio (ref 0.0–4.4)
Cholesterol, Total: 161 mg/dL (ref 100–199)
HDL: 47 mg/dL (ref >39)
LDL Chol Calc (NIH): 91 mg/dL (ref 0–99)
Triglycerides: 132 mg/dL (ref 0–149)
VLDL Cholesterol Cal: 23 mg/dL (ref 5–40)

## 2022-03-18 ENCOUNTER — Other Ambulatory Visit: Payer: Self-pay | Admitting: Physician Assistant

## 2022-03-18 DIAGNOSIS — I10 Essential (primary) hypertension: Secondary | ICD-10-CM

## 2022-03-19 NOTE — Telephone Encounter (Signed)
Requested Prescriptions  Pending Prescriptions Disp Refills   lisinopril (ZESTRIL) 10 MG tablet [Pharmacy Med Name: Lisinopril 10 MG Oral Tablet] 90 tablet 0    Sig: Take 1 tablet by mouth once daily     Cardiovascular:  ACE Inhibitors Passed - 03/18/2022 10:34 AM      Passed - Cr in normal range and within 180 days    Creatinine, Ser  Date Value Ref Range Status  01/29/2022 0.92 0.57 - 1.00 mg/dL Final         Passed - K in normal range and within 180 days    Potassium  Date Value Ref Range Status  01/29/2022 3.9 3.5 - 5.2 mmol/L Final         Passed - Patient is not pregnant      Passed - Last BP in normal range    BP Readings from Last 1 Encounters:  01/29/22 134/71         Passed - Valid encounter within last 6 months    Recent Outpatient Visits           1 month ago Essential (primary) hypertension   [place] Simmons-Robinson, [place], MD   4 months ago Allergic rhinitis due to other allergic trigger, unspecified seasonality   [place] Mikey Kirschner, PA-C   7 months ago Essential (primary) hypertension   [place] Eulas Post, MD   10 months ago Cervical myopathy   [place] Eulas Post, MD   1 year ago Carpal tunnel syndrome of left wrist   [place] Eulas Post, MD

## 2022-03-29 ENCOUNTER — Ambulatory Visit
Admission: RE | Admit: 2022-03-29 | Discharge: 2022-03-29 | Disposition: A | Discharge status: Home / Self Care | Payer: Medicare Other | Source: Ambulatory Visit | Attending: Family Medicine | Admitting: Family Medicine

## 2022-03-29 DIAGNOSIS — M8580 Other specified disorders of bone density and structure, unspecified site: Secondary | ICD-10-CM | POA: Diagnosis present

## 2022-03-29 DIAGNOSIS — Z780 Asymptomatic menopausal state: Secondary | ICD-10-CM | POA: Diagnosis present

## 2022-06-19 ENCOUNTER — Other Ambulatory Visit: Payer: Self-pay | Admitting: Family Medicine

## 2022-06-19 DIAGNOSIS — I10 Essential (primary) hypertension: Secondary | ICD-10-CM

## 2022-06-20 NOTE — Telephone Encounter (Signed)
Unable to refill per protocol, last refill by another provider no longer at this practice. 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 Sullivan Lone.  Requested Prescriptions  Pending Prescriptions Disp Refills   lisinopril (ZESTRIL) 10 MG tablet [Pharmacy Med Name: Lisinopril 10 MG Oral Tablet] 90 tablet 0    Sig: Take 1 tablet by mouth once daily     Cardiovascular:  ACE Inhibitors Passed - 06/19/2022  8:00 PM      Passed - Cr in normal range and within 180 days    Creatinine, Ser  Date Value Ref Range Status  01/29/2022 0.92 0.57 - 1.00 mg/dL Final         Passed - K in normal range and within 180 days    Potassium  Date Value Ref Range Status  01/29/2022 3.9 3.5 - 5.2 mmol/L Final         Passed - Patient is not pregnant      Passed - Last BP in normal range    BP Readings from Last 1 Encounters:  01/29/22 134/71         Passed - Valid encounter within last 6 months    Recent Outpatient Visits           4 months ago Essential (primary) hypertension   [place] [facility] Simmons-Robinson, [place], MD   7 months ago Allergic rhinitis due to other allergic trigger, unspecified seasonality   [place] [facility] [place], [place], PA-C   10 months ago Essential (primary) hypertension   [place] [facility] Bosie Clos, MD   1 year ago Cervical myopathy    [facility] Bosie Clos, MD   1 year ago Carpal tunnel syndrome of left wrist   [facility] Bosie Clos, MD

## 2022-06-27 ENCOUNTER — Other Ambulatory Visit: Payer: Self-pay | Admitting: Family Medicine

## 2022-06-27 DIAGNOSIS — I10 Essential (primary) hypertension: Secondary | ICD-10-CM

## 2022-06-27 NOTE — Telephone Encounter (Signed)
Patient no longer under care Requested Prescriptions  Pending Prescriptions Disp Refills   lisinopril (ZESTRIL) 10 MG tablet [Pharmacy Med Name: Lisinopril 10 MG Oral Tablet] 90 tablet 0    Sig: Take 1 tablet by mouth once daily     Cardiovascular:  ACE Inhibitors Passed - 06/27/2022 10:24 AM      Passed - Cr in normal range and within 180 days    Creatinine, Ser  Date Value Ref Range Status  01/29/2022 0.92 0.57 - 1.00 mg/dL Final         Passed - K in normal range and within 180 days    Potassium  Date Value Ref Range Status  01/29/2022 3.9 3.5 - 5.2 mmol/L Final         Passed - Patient is not pregnant      Passed - Last BP in normal range    BP Readings from Last 1 Encounters:  01/29/22 134/71         Passed - Valid encounter within last 6 months    Recent Outpatient Visits           4 months ago Essential (primary) hypertension   [place] [facility] Simmons-Robinson, [place], MD   8 months ago Allergic rhinitis due to other allergic trigger, unspecified seasonality   [place] [facility] Alfredia Ferguson, PA-C   11 months ago Essential (primary) hypertension   [place] [facility] Bosie Clos, MD   1 year ago Cervical myopathy   [place] [facility] Bosie Clos, MD   1 year ago Carpal tunnel syndrome of left wrist   [facility] Bosie Clos, MD

## 2022-08-24 ENCOUNTER — Other Ambulatory Visit: Payer: Self-pay | Admitting: Family Medicine

## 2022-08-24 DIAGNOSIS — Z1231 Encounter for screening mammogram for malignant neoplasm of breast: Secondary | ICD-10-CM

## 2022-11-08 ENCOUNTER — Ambulatory Visit
Admission: RE | Admit: 2022-11-08 | Discharge: 2022-11-08 | Disposition: A | Discharge status: Home / Self Care | Payer: Medicare Other | Source: Ambulatory Visit | Attending: Family Medicine | Admitting: Family Medicine

## 2022-11-08 DIAGNOSIS — Z1231 Encounter for screening mammogram for malignant neoplasm of breast: Secondary | ICD-10-CM | POA: Insufficient documentation

## 2023-09-19 ENCOUNTER — Other Ambulatory Visit: Payer: Self-pay | Admitting: Family Medicine

## 2023-09-19 DIAGNOSIS — Z1231 Encounter for screening mammogram for malignant neoplasm of breast: Secondary | ICD-10-CM

## 2023-11-11 ENCOUNTER — Ambulatory Visit
Admission: RE | Admit: 2023-11-11 | Discharge: 2023-11-11 | Disposition: A | Discharge status: Home / Self Care | Source: Ambulatory Visit | Attending: Family Medicine | Admitting: Family Medicine

## 2023-11-11 ENCOUNTER — Ambulatory Visit

## 2023-11-11 DIAGNOSIS — Z1231 Encounter for screening mammogram for malignant neoplasm of breast: Secondary | ICD-10-CM | POA: Insufficient documentation
